# Patient Record
Sex: Male | Born: 1960 | Race: White | Hispanic: No | Smoking: Current every day smoker
Health system: Southern US, Community
[De-identification: ages and names within clinical notes are randomized; demographics above are authoritative.]

## PROBLEM LIST (undated history)

## (undated) DIAGNOSIS — K8020 Calculus of gallbladder without cholecystitis without obstruction: Secondary | ICD-10-CM

## (undated) DIAGNOSIS — E079 Disorder of thyroid, unspecified: Secondary | ICD-10-CM

## (undated) DIAGNOSIS — J449 Chronic obstructive pulmonary disease, unspecified: Secondary | ICD-10-CM

## (undated) DIAGNOSIS — R7303 Prediabetes: Secondary | ICD-10-CM

## (undated) DIAGNOSIS — E119 Type 2 diabetes mellitus without complications: Secondary | ICD-10-CM

## (undated) DIAGNOSIS — I2510 Atherosclerotic heart disease of native coronary artery without angina pectoris: Secondary | ICD-10-CM

## (undated) DIAGNOSIS — M549 Dorsalgia, unspecified: Secondary | ICD-10-CM

## (undated) DIAGNOSIS — E032 Hypothyroidism due to medicaments and other exogenous substances: Secondary | ICD-10-CM

## (undated) HISTORY — PX: BACK SURGERY: SHX140

## (undated) HISTORY — DX: Dorsalgia, unspecified: M54.9

## (undated) HISTORY — DX: Calculus of gallbladder without cholecystitis without obstruction: K80.20

## (undated) HISTORY — DX: Atherosclerotic heart disease of native coronary artery without angina pectoris: I25.10

## (undated) HISTORY — PX: CHOLECYSTECTOMY: SHX55

## (undated) HISTORY — DX: Hypothyroidism due to medicaments and other exogenous substances: E03.2

## (undated) HISTORY — DX: Prediabetes: R73.03

---

## 2000-03-03 ENCOUNTER — Emergency Department (HOSPITAL_COMMUNITY): Admission: EM | Admit: 2000-03-03 | Discharge: 2000-03-03 | Payer: Self-pay | Admitting: Emergency Medicine

## 2001-01-31 ENCOUNTER — Inpatient Hospital Stay (HOSPITAL_COMMUNITY): Admission: RE | Admit: 2001-01-31 | Discharge: 2001-02-01 | Payer: Self-pay | Admitting: Neurosurgery

## 2001-01-31 ENCOUNTER — Encounter: Payer: Self-pay | Admitting: Neurosurgery

## 2001-04-16 ENCOUNTER — Emergency Department (HOSPITAL_COMMUNITY): Admission: EM | Admit: 2001-04-16 | Discharge: 2001-04-16 | Payer: Self-pay | Admitting: *Deleted

## 2001-05-24 ENCOUNTER — Encounter: Payer: Self-pay | Admitting: Neurosurgery

## 2001-05-24 ENCOUNTER — Ambulatory Visit (HOSPITAL_COMMUNITY): Admission: RE | Admit: 2001-05-24 | Discharge: 2001-05-24 | Payer: Self-pay | Admitting: Neurosurgery

## 2001-05-29 ENCOUNTER — Emergency Department (HOSPITAL_COMMUNITY): Admission: EM | Admit: 2001-05-29 | Discharge: 2001-05-30 | Payer: Self-pay | Admitting: Emergency Medicine

## 2001-09-21 ENCOUNTER — Emergency Department (HOSPITAL_COMMUNITY): Admission: EM | Admit: 2001-09-21 | Discharge: 2001-09-21 | Payer: Self-pay | Admitting: Emergency Medicine

## 2002-03-13 ENCOUNTER — Encounter: Payer: Self-pay | Admitting: Emergency Medicine

## 2002-03-13 ENCOUNTER — Emergency Department (HOSPITAL_COMMUNITY): Admission: EM | Admit: 2002-03-13 | Discharge: 2002-03-13 | Payer: Self-pay | Admitting: Emergency Medicine

## 2002-03-24 ENCOUNTER — Emergency Department (HOSPITAL_COMMUNITY): Admission: EM | Admit: 2002-03-24 | Discharge: 2002-03-25 | Payer: Self-pay | Admitting: *Deleted

## 2002-03-25 ENCOUNTER — Encounter: Payer: Self-pay | Admitting: Urology

## 2002-03-25 ENCOUNTER — Encounter: Payer: Self-pay | Admitting: *Deleted

## 2002-03-25 ENCOUNTER — Inpatient Hospital Stay (HOSPITAL_COMMUNITY): Admission: AD | Admit: 2002-03-25 | Discharge: 2002-03-28 | Payer: Self-pay | Admitting: Urology

## 2002-03-27 ENCOUNTER — Encounter: Payer: Self-pay | Admitting: Urology

## 2002-04-25 ENCOUNTER — Ambulatory Visit (HOSPITAL_COMMUNITY): Admission: RE | Admit: 2002-04-25 | Discharge: 2002-04-25 | Payer: Self-pay | Admitting: Family Medicine

## 2002-04-25 ENCOUNTER — Encounter: Payer: Self-pay | Admitting: Family Medicine

## 2002-05-01 ENCOUNTER — Emergency Department (HOSPITAL_COMMUNITY): Admission: EM | Admit: 2002-05-01 | Discharge: 2002-05-01 | Payer: Self-pay | Admitting: Emergency Medicine

## 2002-06-25 ENCOUNTER — Emergency Department (HOSPITAL_COMMUNITY): Admission: EM | Admit: 2002-06-25 | Discharge: 2002-06-25 | Payer: Self-pay | Admitting: Emergency Medicine

## 2002-08-15 ENCOUNTER — Encounter: Admission: RE | Admit: 2002-08-15 | Discharge: 2002-11-13 | Payer: Self-pay

## 2002-11-07 ENCOUNTER — Ambulatory Visit (HOSPITAL_COMMUNITY): Admission: RE | Admit: 2002-11-07 | Discharge: 2002-11-07 | Payer: Self-pay | Admitting: Internal Medicine

## 2002-11-07 ENCOUNTER — Encounter: Payer: Self-pay | Admitting: Internal Medicine

## 2002-11-11 ENCOUNTER — Emergency Department (HOSPITAL_COMMUNITY): Admission: EM | Admit: 2002-11-11 | Discharge: 2002-11-11 | Payer: Self-pay | Admitting: *Deleted

## 2002-11-11 ENCOUNTER — Encounter: Payer: Self-pay | Admitting: *Deleted

## 2002-11-24 ENCOUNTER — Emergency Department (HOSPITAL_COMMUNITY): Admission: EM | Admit: 2002-11-24 | Discharge: 2002-11-24 | Payer: Self-pay | Admitting: *Deleted

## 2002-12-29 ENCOUNTER — Emergency Department (HOSPITAL_COMMUNITY): Admission: EM | Admit: 2002-12-29 | Discharge: 2002-12-30 | Payer: Self-pay | Admitting: *Deleted

## 2003-05-12 ENCOUNTER — Emergency Department (HOSPITAL_COMMUNITY): Admission: EM | Admit: 2003-05-12 | Discharge: 2003-05-12 | Payer: Self-pay | Admitting: Emergency Medicine

## 2003-05-26 ENCOUNTER — Emergency Department (HOSPITAL_COMMUNITY): Admission: EM | Admit: 2003-05-26 | Discharge: 2003-05-26 | Payer: Self-pay | Admitting: Emergency Medicine

## 2003-07-21 ENCOUNTER — Emergency Department (HOSPITAL_COMMUNITY): Admission: EM | Admit: 2003-07-21 | Discharge: 2003-07-21 | Payer: Self-pay | Admitting: Emergency Medicine

## 2003-07-27 ENCOUNTER — Emergency Department (HOSPITAL_COMMUNITY): Admission: EM | Admit: 2003-07-27 | Discharge: 2003-07-27 | Payer: Self-pay | Admitting: Emergency Medicine

## 2003-08-28 ENCOUNTER — Emergency Department (HOSPITAL_COMMUNITY): Admission: EM | Admit: 2003-08-28 | Discharge: 2003-08-28 | Payer: Self-pay | Admitting: Emergency Medicine

## 2003-12-29 ENCOUNTER — Emergency Department (HOSPITAL_COMMUNITY): Admission: EM | Admit: 2003-12-29 | Discharge: 2003-12-29 | Payer: Self-pay | Admitting: Emergency Medicine

## 2004-02-28 ENCOUNTER — Emergency Department (HOSPITAL_COMMUNITY): Admission: EM | Admit: 2004-02-28 | Discharge: 2004-02-28 | Payer: Self-pay | Admitting: Emergency Medicine

## 2004-03-02 ENCOUNTER — Emergency Department (HOSPITAL_COMMUNITY): Admission: EM | Admit: 2004-03-02 | Discharge: 2004-03-02 | Payer: Self-pay | Admitting: Emergency Medicine

## 2004-03-15 ENCOUNTER — Emergency Department (HOSPITAL_COMMUNITY): Admission: EM | Admit: 2004-03-15 | Discharge: 2004-03-15 | Payer: Self-pay | Admitting: Emergency Medicine

## 2004-03-22 ENCOUNTER — Emergency Department (HOSPITAL_COMMUNITY): Admission: EM | Admit: 2004-03-22 | Discharge: 2004-03-23 | Payer: Self-pay | Admitting: *Deleted

## 2004-04-20 ENCOUNTER — Emergency Department (HOSPITAL_COMMUNITY): Admission: EM | Admit: 2004-04-20 | Discharge: 2004-04-20 | Payer: Self-pay | Admitting: Emergency Medicine

## 2004-05-01 ENCOUNTER — Emergency Department (HOSPITAL_COMMUNITY): Admission: EM | Admit: 2004-05-01 | Discharge: 2004-05-01 | Payer: Self-pay | Admitting: Emergency Medicine

## 2004-06-13 ENCOUNTER — Emergency Department (HOSPITAL_COMMUNITY): Admission: EM | Admit: 2004-06-13 | Discharge: 2004-06-13 | Payer: Self-pay | Admitting: Emergency Medicine

## 2004-06-15 ENCOUNTER — Emergency Department (HOSPITAL_COMMUNITY): Admission: EM | Admit: 2004-06-15 | Discharge: 2004-06-15 | Payer: Self-pay | Admitting: Emergency Medicine

## 2004-06-23 ENCOUNTER — Emergency Department (HOSPITAL_COMMUNITY): Admission: EM | Admit: 2004-06-23 | Discharge: 2004-06-23 | Payer: Self-pay | Admitting: Emergency Medicine

## 2004-06-23 ENCOUNTER — Ambulatory Visit (HOSPITAL_COMMUNITY): Admission: RE | Admit: 2004-06-23 | Discharge: 2004-06-23 | Payer: Self-pay | Admitting: Emergency Medicine

## 2005-05-01 ENCOUNTER — Emergency Department (HOSPITAL_COMMUNITY): Admission: EM | Admit: 2005-05-01 | Discharge: 2005-05-01 | Payer: Self-pay | Admitting: Emergency Medicine

## 2005-07-09 ENCOUNTER — Emergency Department (HOSPITAL_COMMUNITY): Admission: EM | Admit: 2005-07-09 | Discharge: 2005-07-09 | Payer: Self-pay | Admitting: Emergency Medicine

## 2008-03-25 ENCOUNTER — Ambulatory Visit (HOSPITAL_COMMUNITY): Admission: RE | Admit: 2008-03-25 | Discharge: 2008-03-25 | Payer: Self-pay | Admitting: Family Medicine

## 2008-05-05 ENCOUNTER — Emergency Department (HOSPITAL_COMMUNITY): Admission: EM | Admit: 2008-05-05 | Discharge: 2008-05-05 | Payer: Self-pay | Admitting: Emergency Medicine

## 2009-06-06 ENCOUNTER — Ambulatory Visit: Admission: RE | Admit: 2009-06-06 | Discharge: 2009-06-06 | Payer: Self-pay | Admitting: Neurology

## 2009-10-04 ENCOUNTER — Ambulatory Visit (HOSPITAL_COMMUNITY): Admission: RE | Admit: 2009-10-04 | Discharge: 2009-10-04 | Payer: Self-pay | Admitting: Family Medicine

## 2010-10-27 ENCOUNTER — Ambulatory Visit (HOSPITAL_COMMUNITY)
Admission: RE | Admit: 2010-10-27 | Discharge: 2010-10-27 | Disposition: A | Discharge status: Home / Self Care | Payer: Medicaid Other | Source: Ambulatory Visit | Attending: Family Medicine | Admitting: Family Medicine

## 2010-10-27 ENCOUNTER — Encounter (HOSPITAL_COMMUNITY): Payer: Self-pay

## 2010-10-27 ENCOUNTER — Other Ambulatory Visit (HOSPITAL_COMMUNITY): Payer: Self-pay | Admitting: Family Medicine

## 2010-10-27 DIAGNOSIS — R0602 Shortness of breath: Secondary | ICD-10-CM | POA: Insufficient documentation

## 2010-10-27 DIAGNOSIS — J449 Chronic obstructive pulmonary disease, unspecified: Secondary | ICD-10-CM | POA: Insufficient documentation

## 2010-10-27 DIAGNOSIS — R059 Cough, unspecified: Secondary | ICD-10-CM | POA: Insufficient documentation

## 2010-10-27 DIAGNOSIS — R05 Cough: Secondary | ICD-10-CM | POA: Insufficient documentation

## 2010-10-27 DIAGNOSIS — R634 Abnormal weight loss: Secondary | ICD-10-CM | POA: Insufficient documentation

## 2010-10-27 DIAGNOSIS — J4489 Other specified chronic obstructive pulmonary disease: Secondary | ICD-10-CM | POA: Insufficient documentation

## 2010-10-27 HISTORY — DX: Chronic obstructive pulmonary disease, unspecified: J44.9

## 2010-11-02 ENCOUNTER — Other Ambulatory Visit (HOSPITAL_COMMUNITY): Payer: Self-pay | Admitting: Family Medicine

## 2010-11-02 DIAGNOSIS — E0590 Thyrotoxicosis, unspecified without thyrotoxic crisis or storm: Secondary | ICD-10-CM

## 2010-11-07 ENCOUNTER — Encounter (HOSPITAL_COMMUNITY): Payer: Medicaid Other

## 2010-11-08 ENCOUNTER — Encounter (HOSPITAL_COMMUNITY): Admission: RE | Admit: 2010-11-08 | Payer: Medicaid Other | Source: Ambulatory Visit

## 2010-11-10 ENCOUNTER — Encounter (HOSPITAL_COMMUNITY): Payer: Self-pay

## 2010-11-10 ENCOUNTER — Encounter (HOSPITAL_COMMUNITY): Payer: Medicaid Other

## 2010-11-10 ENCOUNTER — Encounter (HOSPITAL_COMMUNITY)
Admission: RE | Admit: 2010-11-10 | Discharge: 2010-11-10 | Disposition: A | Discharge status: Home / Self Care | Payer: Medicaid Other | Source: Ambulatory Visit | Attending: Family Medicine | Admitting: Family Medicine

## 2010-11-10 DIAGNOSIS — E0590 Thyrotoxicosis, unspecified without thyrotoxic crisis or storm: Secondary | ICD-10-CM

## 2010-11-10 DIAGNOSIS — E349 Endocrine disorder, unspecified: Secondary | ICD-10-CM | POA: Insufficient documentation

## 2010-11-10 DIAGNOSIS — R634 Abnormal weight loss: Secondary | ICD-10-CM | POA: Insufficient documentation

## 2010-11-10 MED ORDER — SODIUM IODIDE I 131 CAPSULE
10.0000 | Freq: Once | INTRAVENOUS | Status: AC | PRN
  Administered 2010-11-10: 13.9 via ORAL

## 2010-11-10 MED ORDER — SODIUM PERTECHNETATE TC 99M INJECTION
10.0000 | Freq: Once | INTRAVENOUS | Status: DC | PRN
  Administered 2010-11-11: 10.5 via INTRAVENOUS

## 2010-12-09 ENCOUNTER — Other Ambulatory Visit (HOSPITAL_COMMUNITY): Payer: Self-pay | Admitting: "Endocrinology

## 2010-12-09 DIAGNOSIS — E0500 Thyrotoxicosis with diffuse goiter without thyrotoxic crisis or storm: Secondary | ICD-10-CM

## 2010-12-12 ENCOUNTER — Ambulatory Visit (HOSPITAL_COMMUNITY)
Admission: RE | Admit: 2010-12-12 | Discharge: 2010-12-12 | Disposition: A | Discharge status: Home / Self Care | Payer: Medicaid Other | Source: Ambulatory Visit | Attending: "Endocrinology | Admitting: "Endocrinology

## 2010-12-12 DIAGNOSIS — E0590 Thyrotoxicosis, unspecified without thyrotoxic crisis or storm: Secondary | ICD-10-CM | POA: Insufficient documentation

## 2010-12-12 DIAGNOSIS — E0500 Thyrotoxicosis with diffuse goiter without thyrotoxic crisis or storm: Secondary | ICD-10-CM

## 2010-12-12 MED ORDER — SODIUM IODIDE I 131 CAPSULE
20.0000 | Freq: Once | INTRAVENOUS | Status: AC | PRN
  Administered 2010-12-12: 20.2 via ORAL

## 2011-01-20 NOTE — Consult Note (Signed)
NAME:  Steven Larson, ASBRIDGE NO.:  [id number]   MEDICAL RECORD NO.:  [id number]                   PATIENT TYPE:  REC   LOCATION:  [facility]                                  FACILITY:  [facility]   PHYSICIAN:  Zachary George, DO                      DATE OF BIRTH:  Mar 02, 1961   DATE OF CONSULTATION:  08/18/2002  DATE OF DISCHARGE:                                   CONSULTATION   REFERRING PHYSICIAN:  Mr. Carneal was referred by Dr. Butch Penny, [address] [place], [place], [address].   Dear Dr. Renard Matter,  Thank you very much for kindly referring Mr. [facility] to [facility] for evaluation.  Steven Larson was evaluated  in the clinic today.  Please refer to the following for details regarding  the history and physical examination, and treatment recommendations.  Once  again, thank you for allowing [place] to participate in the care of Steven Larson.   CHIEF COMPLAINT:  Pain running from back down in leg.   HISTORY OF PRESENT ILLNESS:  The patient is a pleasant 50 year old right-  hand dominant male who complains of chronic low back pain.  He states he  injured his low back initially in 2001, while loading wood on a truck.  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  He was found to have a disk herniation and underwent lumbar surgery in  5/01, per Dr. Newell Coral.  He continues to have pain radiating from his low  back into his left buttock, anterior thigh, and anterior leg.  He was  referred by Dr. Newell Coral to Dr. Thyra Breed approximately one year ago  where he underwent three lumbar epidural steroid injections without relief,  and was referred back to Dr. Newell Coral and then to his primary care  physician, Dr. Renard Matter.  His pain is an 8/10 on a subjective scale, and  described as constant with numbness in his left anterior lateral thigh.  He  also describes some weakness in the associated  distribution.  His symptoms  are worse with walking, bending, and sitting, and improved with medications  to some degree.  He states that Dr. Renard Matter has had him taking Lortab 10  mg/500 mg approximately six per day over the past year or two.  He also  takes Aleve two pills q.12h. which helps as well.  He has tried Avinza which  causes an itch.  He has also tried Tylox per Dr. Newell Coral.  He has been on  Vioxx, Celebrex, and Neurontin without relief, and has also tried Ultracet  and Elavil.  He states the Elavil caused him a drunk feeling.  He denies any  bowel and bladder dysfunction.  He denies any fevers, chills, night sweats,  weight loss.  I reviewed health and history form and 14 point review of  systems.  Function and quality of life indices have declined.  Sleep is fair  to poor.   PAST MEDICAL HISTORY:  Denies.   PAST SURGICAL HISTORY:  Lumbar surgery as noted.   FAMILY HISTORY:  Heart disease, cancer.   SOCIAL HISTORY:  The patient smokes two packs of cigarettes per day, and I  counseled him on the importance of smoking cessation in terms of pain  control and overall health.  Alcohol denies.  Illicit drug use denies.  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   ALLERGIES:  No known drug allergies.   MEDICATIONS:  1. Lortab.  2. Aleve.   PHYSICAL EXAMINATION:  GENERAL:  A healthy-appearing male in no acute  distress.  VITAL SIGNS:  Blood pressure is 141/68, pulse 77, respirations 20, O2  saturation 97% on room air.  BACK:  Level pelvis without scoliosis.  There is a healed vertical midline  incisional scar.  Range of motion of the lumbar spine is limited in all  planes secondary to discomfort.  There is tenderness to palpation bilateral  lumbar paraspinal muscles.  Manual muscle testing is 5/5 bilateral lower  extremities.  Sensory examination reveals decreased light  touch in the left  anterior lateral thigh proximally.  Muscle stretch reflexes are 2+/4  bilateral patellar, medial hamstrings, and Achilles.  Straight leg raise is  negative bilaterally, but with significant tightness in the hamstrings.  Pearlean Brownie is negative bilaterally, but with significant tightness in the hip  flexor muscles bilaterally.  There is no heat, erythema, or edema in the  lower extremities.  There is no abnormal tone in the lower extremities.  Ankle clonus is absent bilaterally.  Gait is normal.   LABORATORY DATA:  Imaging studies:  MRI of the lumbar spine dated 04/25/02,  reveals stable mild diffuse disk bulging without significant canal or  foraminal stenosis at L3-4.  At L4-5, there is interval development of  enhancing scar tissue within the neural foramen and extra foraminal soft  tissues on the left, as well as in the posterior lateral aspect of the disk  on the left.  The scar tissue is enhancing the left L4 nerve root within and  lateral to the neural foramen with no nerve root displacement.  No recurrent  disk herniation is identified.  Also noted is surgical absence of the  lateral aspects of the facets on the left at the this level.  There has been  no significant change in mild to moderate diffuse disk bulking and mild  facet hypertrophy on the right at this level without significant canal or  foraminal stenosis.  At L5-S1, there is stable minimal diffuse disk bulging  without focal disk herniation.  There is mild bilateral facet hypertrophy  noted with mild progression.   IMPRESSION:  1. Chronic low back pain, status post lumbar surgery with persistent left     lower extremity radicular symptoms in an L4 distribution with scar tissue     enhancement surrounding the left L4 nerve root.  2. Left L4 radiculitis/radiculopathy, likely secondary to scar tissue     postoperatively. 3. Non-restorative sleep disorder.  4. Tobacco abuse.   RECOMMENDATIONS:  1. Mr.  Mirsky and I discussed treatment options.  I will make     recommendations to his primary care  physician for a plan of care.  2. Recommend continuing a home based exercise program concentrating on     lumbar stabilization exercises as well as proper body mechanics,     especially if the patient is to re-enter the work force in a physical     type job in the tree business.  3. Consider a membrane stabilizing medication such as Zonegran 100 mg daily.     This could be increased by 100 mg every two weeks up to 400 mg.     Typically, if no improvement is noted after 200 mg, I recommend     discontinuing this medication and switching to another such as Gabitril     up to 3200 mg q.d. beginning with 4 mg q.h.s. x3 days, b.i.d. x3 days,     then t.i.d. initially.  If symptoms do not improve with this, could give     consideration to re-initiating Neurontin up to 1800 mg daily starting     with 300 mg q.h.s. x3 days, b.i.d. x3 days, then t.i.d.  4. Consider Pamelor 10 to 25 mg at bedtime, and increasing up to 50 mg as     tolerated.  5. Consider following up with Dr. Vear Clock who was his pain management     physician previously.  Dr. Vear Clock was involved in his interventional     pain management.  6. Consider a trial of Duragesic 25 mcg q.72h. if symptoms are not improving     with the above noted measures.  7. Recommend smoking cessation for best long-term outcome.  8. Consider Lidoderm patches for 12 hours per day to his lower back.  9. I do not feel that further imaging studies are warranted at this time.  10.      I do not feel that further interventional procedures are warranted     at this time.  Could give consideration to a left L4 selective nerve root     block if symptoms are not improving with the above medications.  11.      The patient is to follow up with Dr. Renard Matter.   The patient was educated on the above findings and recommendations and  understands.  There were no barriers  to communication.                                               Zachary George, DO    JW/MEDQ  D:  08/18/2002  T:  08/19/2002  Job:  [id number]   cc:   Angus G. Renard Matter, M.D.  [address]  [place]  [place] [address]  Fax: [phone]

## 2011-01-20 NOTE — H&P (Signed)
[place]  Patient:    Steven Larson, Steven Larson                          MRN: [id number] Adm. Date:  [id number] Attending:  Barton Fanny                         History and Physical  HISTORY OF PRESENT ILLNESS:  The patient is a 50 year old right handed white male who was evaluated for a left lumbar radiculopathy secondary to a left L4-5 lumbar extraforaminal disk herniation. 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 He was lifting a heavy oak log. Since then he has had pain and discomfort in his pain extending down to his left buttock and thigh and numbness in the anterior aspect of his left thigh. He states that if stands for an extended period of time, he will begin to get pain and numbness through the left lower extremity and if he is climbing a tree striking in he will get jolts of pain and discomfort. He has been taking Tylox at night to help him rest. He was evaluated with an MRI scan that showed degenerative disk disease L3-4, L4-5, and L5-S1 with left L4-5 foraminal and extraforaminal disk herniation with compression of the left L4 nerve roots. The patient is admitted now for surgery.  PAST MEDICAL HISTORY:  There is no history of hypertension, myocardial infarction, cancer, stroke, diabetes, peptic ulcer disease or lung disease. No previous surgeries, no known allergies.  CURRENT MEDICATIONS:  Tylox.  FAMILY HISTORY:  Parents have both passed on.  SOCIAL HISTORY:  The patient is separated. He has custody of his son. He smokes two packs a day. He has been smoking for 23 years. He does not drink alcohol and denies a history of substance abuse.  REVIEW OF SYSTEMS:  Review of systems is as described in his history of present illness and his past medical history was otherwise unremarkable.  PHYSICAL EXAMINATION:  GENERAL:  The patient is a well-developed, well-nourished, white male in no acute  distress.  VITAL SIGNS:  Temperature 97.3, pulse 88, blood pressure 122/70, respiratory rate 18, height 6 foot, weight 200 pounds.  LUNGS:  Clear to auscultation. She has symmetrical respirations.  HEART:  Regular rate and rhythm. Normal S1, S2. There is no murmur.  ABDOMEN:  Soft, nondistended, bowel sounds are present.  EXTREMITIES:  Shows no cyanosis, clubbing or edema.  MUSCULOSKELETAL:  Shows tenderness to palpation in the left perilumbar region and none in the paralumbar region, none in the right paralumbar spinous processes themselves. He is limited in forward flexion to about 20-30 degrees due to pain and discomfort. He is limited in extension as well due to pain and discomfort. Straight leg raising on the left at about 80-90 degrees produced pain at the left side of his low back. Straight leg raising is negative on the right.  NEUROLOGIC:  5/5 strength in the lower extremity including dorsiflexion, plantar flexion, and extensor hallucis longus. Sensation intact to pinprick in the lower extremities. Reflexes are 1-2 at the quadriceps and gastrocnemii. and are symmetrical bilaterally, toes are downgoing bilaterally. He has a normal gait and stance.  IMPRESSION:  Left lumbar radiculopathy secondary to a L4-5 foraminal and extraforaminal disk herniation superimposed upon underlying degenerative disk disease.  PLAN:  The patient will be admitted for a left L4-5 extraforaminal microdiskectomy.  I have discussed the alternatives to surgery, the nature of the surgical procedure itself and typical risks of surgery, hospital stay and overall recuperation, his limitations during the postoperative period and risks of surgery including risks of infection, bleeding, possibility of transfusions, the risk of nerve dysfunction and pain, weakness, numbness, paresthesias, risk of recurrent disk herniations, possible need for further surgery, anesthetic risks, myocardial infarction. After  expressing an understanding of all this, he does wish to proceed with surgery and is admitted for such. DD:  01/31/01 TD:  01/31/01 Job: [id number]

## 2011-01-20 NOTE — Op Note (Signed)
[facility]  Patient:    Steven Larson, Steven Larson Visit Number: [id number] MRN: [id number]          Service Type: MED Location: 3A A326 01 Attending Physician:  Dennie Maizes Dictated by:   Dennie Maizes, M.D. Proc. Date: 03/27/02 Admit Date:  03/25/2002 Discharge Date: 03/28/2002                             Operative Report  PREOPERATIVE DIAGNOSES:  1. Left distal ureteral calculus with obstruction.  2. Left renal colic.  3. Left hydronephrosis.  POSTOPERATIVE DIAGNOSES:  1. Left distal ureteral calculus with obstruction.  2. Left renal colic.  3. Left hydronephrosis.  OPERATION/PROCEDURE:  1. Cystoscopy.  2. Left retrograde pyelogram.  3. Ureteroscopic stone manipulation and left ureteral stent placement.  SURGEON:  Dennie Maizes, M.D.  ANESTHESIA:  General.  COMPLICATIONS:  None.  DRAINS:  Six French 26 cm size left ureteral stent with a string.  INDICATIONS FOR PROCEDURE:  This 50 year old male was evaluated for severe left renal colic.  He had a 4 mm size left upper ureteral calculus with obstruction.  He is taken to the operating room today for cystoscopy, left retrograde pyelogram, ureteroscopic stone extraction, and stent placement.  DESCRIPTION OF PROCEDURE:  General anesthesia was induced and the patient was placed on the OR table in the dorsal lithotomy position.  The lower abdomen and genitalia were prepped and draped in sterile fashion.  Cystoscopy was done with a 25 [place] scope.  Appearance of the urethra, prostate, and bladder were normal.  A 5 French Burch catheter was then placed in the left ureteral orifices.  About 7 cc of Renografin 60 was injected into the collecting system and a retrograde pyelogram done with C-arm fluoroscopy.  There was a filling defect about 5 mm in size about 5 cm above the ureteral orifice.  The collecting system above the filling defect was found to be moderately dilated.  A 5 French open-end  catheter was then placed in the left distal ureter.  A 0.08 ______ with a flexible tip was then advanced into the left collecting system.  The distal ureter was then dilated using an 33 French balloon dilating catheter.  The balloon dilating catheter was then removed, leaving the guide wire in place.  The rigid ureteroscope was then inserted into the left distal ureter.  There was a lot of fibrosis and narrowing just distal to the stone.  The stone was seen with some difficulty.  A four-wire basket was then inserted up to the level of the stone.  Several attempts were made to retrieve the stone.  The stone crumbled into pieces during these attempts. Several sand-like particles were removed.  Some of the stone fragments floated into the upper collecting system.  The stone was fragmented into several pieces and each could be passed easily.  A 6 French 26 cm size stent with a string was then inserted into the left collecting system.  Instruments were removed.  The patient was transferred to the PACU in satisfactory condition. Dictated by:   Dennie Maizes, M.D. Attending Physician:  Dennie Maizes DD:  03/27/02 TD:  04/01/02 Job: [id number]

## 2011-01-20 NOTE — Op Note (Signed)
[name]  Patient:    Steven Larson, Steven Larson                          MRN: [id number] Proc. Date: 01/31/01 Adm. Date:  [id number] Attending:  Barton Fanny CC:         Hewitt Shorts, M.D.  Patients chart   Operative Report  PREOPERATIVE DIAGNOSIS:  Left lumbar vertebrae-4/5 extraforaminal lumbar disc herniation.  POSTOPERATIVE DIAGNOSIS:  Left lumbar vertebrae-4/5 extraforaminal lumbar disc herniation.  OPERATION:  Left L4/5 extraforaminal microdiskectomy with microdissection.  SURGEON:  Hewitt Shorts, M.D.  ASSISTANT:  Tanya Nones. Jeral Fruit, M.D.  ANESTHESIA:  General endotracheal anesthesia.  INDICATION:  The patient is a 50 year old man who presented with left lumbar radiculopathy found to be due to a left L4/5 foraminal and extraforaminal disc herniation.  Decision was made to proceed with extraforaminal microdiskectomy.  DESCRIPTION OF PROCEDURE:  The patient brought to the operating room and placed under general endotracheal anesthesia.  The patient was turned to a prone position.  Lumbar region was prepped with DuraPrep and draped in a sterile fashion.  The midline was infiltrated with local anesthetic with epinephrine and a localizing x-ray taken.  The L4/5 level identified and a midline incision made.  It was carried down to the subcutaneous tissue. Bipolar cautery and electrocautery were used to maintain hemostasis.  Dissection was carried down to the lumbar fascia which was incised on the left side of the midline and the paraspinous muscle was dissected from the spinous process of the lamina in a subperiosteal fashion.  Another x-ray was taken at the L4/5 level to further localize.  Then we were able to dissect laterally over the facet joint.  We identified the left L5 transverse process and another aspect of the facet complex.  The microscope was draped and brought in the field to provide additional  magnification, illumination, and visualization and the remainder of the procedure was performed using microdissection with microsurgical technique.  We were able to perform a lateral fasciectomy as well as removing the superior aspect of the transverse process with the micro-max drill.  We dissected down and identified the left L4 nerve root and the annulus of the L4/5 disc.  The disc herniation was subligamentous and there was spondylitic disc protrusion as well.  We were able to incise the annulus and proceeded with the discectomy removing all loose fragments of disc material from both disc space and the extraforaminal space.  Spondylitic overgrowth and spondylitic ligament thickening was removed and we were able to achieve good decompression of the nerve root.  Once all loose fragments of disc material were removed, hemostasis was established with the use of bipolar cautery.  We then instilled 2 cc of fentanyl and 80 mg of Depo-Medrol into the extraforaminal space and then proceeded with closure.  The deep fascia was closed with interrupted undyed 0 Vicryl sutures.  The subcutaneous and subcuticular closed with interrupted inverted 2-0 undyed Vicryl sutures.  The skin was reapproximated with Dermabond.  The patient tolerated the procedure well.  The estimated blood loss was 50 cc. Sponge and needle count were correct.  Following surgery, the patient is to be turned back to the supine position, reversed from anesthetic, extubated, and transferred to the recovery room for further care. DD:  01/31/01 TD:  02/01/01 Job: [id number]

## 2011-01-20 NOTE — H&P (Signed)
[facility]  Patient:    Steven Larson, MCPEEK Visit Number: [id number] MRN: [id number]          Service Type: MED Location: 3A A326 01 Attending Physician:  Dennie Maizes Dictated by:   Dennie Maizes, M.D. Admit Date:  03/25/2002   CC:         Butch Penny, M.D.   History and Physical  CHIEF COMPLAINT:  Severe left flank pain radiating to the front, nausea and vomiting.  HISTORY OF PRESENT ILLNESS:  This 50 year old male came to the emergency room about a week ago complaining severe left pain radiating to the front with associated nausea and vomiting.  He was evaluated with a non-contrast CT scan of the abdomen and pelvis.  This revealed a 4 mm proximal left ureteral calculus with mild obstruction.  The patient was seen in the office and treated with pain pills.  He returned to the office today with persistence of severe pain, nausea and vomiting.  The patient was referred to the hospital for further evaluation.  He had an ER visit last night which revealed a 4 mm stone had migrated to the lower ureter.  The patient was admitted to the hospital for pain control, IV fluids, and treatment.  PAST MEDICAL HISTORY:  Negative for urolithiasis.  No medical illnesses. Status post back surgery in May 2002.  MEDICATIONS:  None.  ALLERGIES:  None.  PHYSICAL EXAMINATION:  GENERAL:  The patient was found to be in severe pain.  HEENT:  Normal.  NECK:  No masses.  LUNGS:  Clear to auscultation.  HEART:  Regular rate and rhythm with no murmurs.  ABDOMEN:  Soft.  No palpable flank mass.  Moderate left costovertebral angle tenderness as noted.  Bladder not palpable.  GU:  Penis and testes were normal.  LABORATORY DATA:  CBC: WBC 9.7, hemoglobin 14.4, hematocrit 41.0.  BUN 13, creatinine 1.3.  IMPRESSION: 1. Left distal ureteral calculus with obstruction. 2. Left hydronephrosis. 3. Left renal colic.  PLAN: 1. We will admit the patient to the  hospital. 2. Narcotics. 3. IV fluids. 4. Discussed with the patient about management options.  If he has severe    and persistent pain, I would proceed with cystoscopy and left retrograde    pyelogram, possible ureteroscopy stone extraction and stent placement.  I have discussed with the patient regarding the operative details, outcome, possible risks and complications, alternative treatments, and the possibility of stone migration during attempted removal. Dictated by:   Dennie Maizes, M.D. Attending Physician:  Dennie Maizes DD:  03/26/02 TD:  03/26/02 Job: [id number]

## 2011-01-20 NOTE — Discharge Summary (Signed)
Steven Larson, Steven Larson                             ACCOUNT NO.:  [id number]   MEDICAL RECORD NO.:  [id number]                   PATIENT TYPE:  INP   LOCATION:  A326                                 FACILITY:  [facility]   PHYSICIAN:  Dennie Maizes, M.D.                DATE OF BIRTH:  10/27/1960   DATE OF ADMISSION:  03/25/2002  DATE OF DISCHARGE:  03/28/2002                                 DISCHARGE SUMMARY   FINAL DIAGNOSIS:  Left distal ureteral calculus with obstruction.   OTHER DIAGNOSES:  1. Left hydronephrosis.  2. Left renal colic.   OPERATIVE PROCEDURE:  Cystoscopy, left retrograde pyelogram, left  ureteroscopy stone manipulation and left ureteral stent placement done on  March 27, 2002.   COMPLICATIONS:  None.   HISTORY OF PRESENT ILLNESS:  This 50 year old man came to the emergency room  about one week ago complaining of severe left flank pain radiating to the  front with associated nausea and vomiting.  He was evaluated with a  noncontrast CT scan of abdomen and pelvis.  This revealed a 4 mm size  proximal left ureteral calculus with mild obstruction.  The patient was seen  subsequently in the office and treated with pain pills.  He returned to the  office on March 25, 2002, with persistent severe pain, nausea, and vomiting.  He was referred to the ER for further evaluation.   X-rays revealed a 4 mm size stone that migrated to the lower ureter.  The  patient was admitted to the hospital for pain control, IV fluids, and  definitive treatment.   PAST MEDICAL HISTORY:  Negative for urolithiasis.  No medical illnesses.   PAST SURGICAL HISTORY:  Status post back surgery in May 2002.   MEDICATIONS:  None.   ALLERGIES:  None.   PHYSICAL EXAMINATION:  GENERAL: The patient was found to be in severe pain.  HEENT: Normal.  NECK: No masses.  LUNGS: Clear to auscultation.  HEART: Regular rate and rhythm; no murmurs.  ABDOMEN: Soft; no palpable flank mass.  Moderate left  costovertebral angle  tenderness was noted.  Bladder was not palpable.  GENITALIA: Penis and testes are normal.   ADMISSION LABORATORIES:  CBC: WBC 9.7, hemoglobin 14.4, hematocrit 41.0.  BUN 13, creatinine 1.3.   COURSE IN THE HOSPITAL:  The patient was admitted to the hospital and  treated with IV fluids and pain narcotics.  He had adequate relief of pain.  An x-ray of the KUB area was done for localization of stone.  This revealed  a faint calcification 4 mm in size at the level of L5 spinous process on the  left side.  The patient was treated with PCA morphine with good control of  the pain.  I discussed with the patient regarding management options.  He  wanted surgical removal of the calculus.  He was taken to the OR on March 27, 2002, and underwent cystoscopy, left retrograde pyelogram, left  ureteroscopic stone manipulation and left ureteral stone placement.  The  patient did well in the postoperative period.  He passed a stone which was  sent for routine analysis.  The patient was discharged and sent home on March 28, 2002.   DISCHARGE MEDICATIONS:  1. Levaquin 5 mg one p.o. q.d. x5 days.  2. Percocet 5/325 one p.o. q.8 h. p.r.n. pain (#20).   FOLLOWUP:  He will return to the office on April 01, 2002, for stent removal.   DISCHARGE INSTRUCTIONS:  The patient was advised to call me for any fevers,  chills, voiding difficulty, or gross hematuria.                                               Dennie Maizes, M.D.    SK/MEDQ  D:  04/12/2002  T:  04/17/2002  Job:  [id number]

## 2012-03-10 ENCOUNTER — Emergency Department (HOSPITAL_COMMUNITY)
Admission: EM | Admit: 2012-03-10 | Discharge: 2012-03-11 | Disposition: A | Discharge status: Home / Self Care | Payer: Medicaid Other | Attending: Emergency Medicine | Admitting: Emergency Medicine

## 2012-03-10 ENCOUNTER — Emergency Department (HOSPITAL_COMMUNITY): Payer: Medicaid Other

## 2012-03-10 ENCOUNTER — Encounter (HOSPITAL_COMMUNITY): Payer: Self-pay | Admitting: *Deleted

## 2012-03-10 DIAGNOSIS — S0083XA Contusion of other part of head, initial encounter: Secondary | ICD-10-CM | POA: Insufficient documentation

## 2012-03-10 DIAGNOSIS — M542 Cervicalgia: Secondary | ICD-10-CM | POA: Insufficient documentation

## 2012-03-10 DIAGNOSIS — Z79899 Other long term (current) drug therapy: Secondary | ICD-10-CM | POA: Insufficient documentation

## 2012-03-10 DIAGNOSIS — IMO0002 Reserved for concepts with insufficient information to code with codable children: Secondary | ICD-10-CM | POA: Insufficient documentation

## 2012-03-10 DIAGNOSIS — R0602 Shortness of breath: Secondary | ICD-10-CM | POA: Insufficient documentation

## 2012-03-10 DIAGNOSIS — F17200 Nicotine dependence, unspecified, uncomplicated: Secondary | ICD-10-CM | POA: Insufficient documentation

## 2012-03-10 DIAGNOSIS — R059 Cough, unspecified: Secondary | ICD-10-CM | POA: Insufficient documentation

## 2012-03-10 DIAGNOSIS — S46912A Strain of unspecified muscle, fascia and tendon at shoulder and upper arm level, left arm, initial encounter: Secondary | ICD-10-CM

## 2012-03-10 DIAGNOSIS — S0003XA Contusion of scalp, initial encounter: Secondary | ICD-10-CM | POA: Insufficient documentation

## 2012-03-10 DIAGNOSIS — J438 Other emphysema: Secondary | ICD-10-CM | POA: Insufficient documentation

## 2012-03-10 DIAGNOSIS — R05 Cough: Secondary | ICD-10-CM | POA: Insufficient documentation

## 2012-03-10 DIAGNOSIS — S1093XA Contusion of unspecified part of neck, initial encounter: Secondary | ICD-10-CM

## 2012-03-10 NOTE — ED Provider Notes (Signed)
History     CSN: [id number]  Arrival date & time 03/10/12  2241   First MD Initiated Contact with Patient 03/10/12 2304      Chief Complaint  Patient presents with  . Neck Pain  . Fall    (Consider location/radiation/quality/duration/timing/severity/associated sxs/prior treatment) Patient is a 51 y.o. male presenting with neck pain and fall. The history is provided by the patient.  Neck Pain  This is a new problem. The current episode started 6 to 12 hours ago. The problem occurs hourly. The problem has been gradually worsening. The pain is associated with a fall (Pt slipped on a skateboard). There has been no fever. The pain is present in the generalized neck. The pain radiates to the left arm. The pain is severe. Exacerbated by: certain positions. The pain is the same all the time. Pertinent negatives include no photophobia and no chest pain. Associated symptoms comments: Left arm and shoulder burning and painful..  Fall Pertinent negatives include no abdominal pain and no hematuria.    Past Medical History  Diagnosis Date  . COPD (chronic obstructive pulmonary disease)   . Emphysema     Past Surgical History  Procedure Date  . Back surgery     History reviewed. No pertinent family history.  History  Substance Use Topics  . Smoking status: Current Everyday Smoker  . Smokeless tobacco: Not on file  . Alcohol Use: No      Review of Systems  Constitutional: Negative for activity change.       All ROS Neg except as noted in HPI  HENT: Positive for neck pain. Negative for nosebleeds.   Eyes: Negative for photophobia and discharge.  Respiratory: Positive for cough and shortness of breath. Negative for wheezing.   Cardiovascular: Negative for chest pain and palpitations.  Gastrointestinal: Negative for abdominal pain and blood in stool.  Genitourinary: Negative for dysuria, frequency and hematuria.  Musculoskeletal: Positive for back pain. Negative for arthralgias.    Skin: Negative.   Neurological: Negative for dizziness, seizures and speech difficulty.  Psychiatric/Behavioral: Negative for hallucinations and confusion.    Allergies  Review of patient's allergies indicates no known allergies.  Home Medications   Current Outpatient Rx  Name Route Sig Dispense Refill  . METHADONE HCL 10 MG PO TABS Oral Take 10 mg by mouth every 8 (eight) hours.    Marland Kitchen PREGABALIN 75 MG PO CAPS Oral Take 75 mg by mouth 2 (two) times daily.      BP 144/77  Pulse 74  Temp 98.1 F (36.7 C)  Resp 20  Ht 6\' 1"  (1.854 m)  Wt 190 lb (86.183 kg)  BMI 25.07 kg/m2  SpO2 97%  Physical Exam  Nursing note and vitals reviewed. Constitutional: He is oriented to person, place, and time. He appears well-developed and well-nourished.  Non-toxic appearance.  HENT:  Head: Normocephalic.  Right Ear: Tympanic membrane and external ear normal.  Left Ear: Tympanic membrane and external ear normal.  Eyes: EOM and lids are normal. Pupils are equal, round, and reactive to light.  Neck: Carotid bruit is not present.       Pt has c collar in place.  Cardiovascular: Normal rate, regular rhythm, normal heart sounds, intact distal pulses and normal pulses.   Pulmonary/Chest: No respiratory distress. He has rhonchi.  Abdominal: Soft. Bowel sounds are normal. There is no tenderness. There is no guarding.  Musculoskeletal: Normal range of motion.       Soreness with ROM of the  left shoulder. FROM of the left elbow and wrist. No deformity. No dislocation. Pulses symmetrical.  Lymphadenopathy:       Head (right side): No submandibular adenopathy present.       Head (left side): No submandibular adenopathy present.  Neurological: He is alert and oriented to person, place, and time. He has normal strength. No cranial nerve deficit or sensory deficit.  Skin: Skin is warm and dry.  Psychiatric: He has a normal mood and affect. His speech is normal.    ED Course  Procedures (including  critical care time)  Labs Reviewed - No data to display No results found.   No diagnosis found.    MDM  I have reviewed nursing notes, vital signs, and all appropriate lab and imaging results for this patient. After neck cleared by CT, pt examined. He has soreness with attempted ROM. No bruits. No step off. Soreness of the left trapezius area. Grip is symmetrical. Pt to see Dr Janna Arch for evaluation and additional treatment of the left neck and shoulder.       Kathie Dike, [place] 03/11/12 [phone]

## 2012-03-10 NOTE — ED Notes (Signed)
Patient states that he was at home and stepped onto his son's skateboard, fell backward and struck the back of his neck on a chair.  States that the incident occurred at approx. 100 pm today.  States he is having pain that shoots down his left arm, burning in nature.  States that his index and middle fingers on his left hand arm numb.  States that he took pain medications at home trying to ease the pain, which did not work and that is why he has come to the ER.

## 2012-03-10 NOTE — ED Notes (Signed)
Pt slipped on sons skateboard earlier today. Pt states he fell on his neck. Pt now c/o neck and arm pain with burning sensation down left arm.

## 2012-03-11 MED ORDER — HYDROCODONE-ACETAMINOPHEN 5-325 MG PO TABS
2.0000 | ORAL_TABLET | Freq: Once | ORAL | Status: AC
  Administered 2012-03-11: 2 via ORAL
  Filled 2012-03-11: qty 2

## 2012-03-11 MED ORDER — ONDANSETRON HCL 4 MG PO TABS
4.0000 mg | ORAL_TABLET | Freq: Once | ORAL | Status: AC
  Administered 2012-03-11: 4 mg via ORAL
  Filled 2012-03-11: qty 1

## 2012-03-11 MED ORDER — HYDROCODONE-ACETAMINOPHEN 7.5-325 MG PO TABS: 1.0000 | ORAL_TABLET | ORAL | Status: AC | PRN

## 2012-03-11 MED ORDER — MELOXICAM 7.5 MG PO TABS: 7.5000 mg | ORAL_TABLET | Freq: Every day | ORAL | Status: AC

## 2012-03-11 MED ORDER — METHOCARBAMOL 500 MG PO TABS
1000.0000 mg | ORAL_TABLET | Freq: Once | ORAL | Status: AC
  Administered 2012-03-11: 1000 mg via ORAL
  Filled 2012-03-11: qty 2

## 2012-03-11 MED ORDER — IBUPROFEN 800 MG PO TABS
800.0000 mg | ORAL_TABLET | Freq: Once | ORAL | Status: AC
  Administered 2012-03-11: 800 mg via ORAL
  Filled 2012-03-11: qty 1

## 2012-03-11 MED ORDER — METHOCARBAMOL 500 MG PO TABS: 500.0000 mg | ORAL_TABLET | Freq: Two times a day (BID) | ORAL | Status: AC

## 2012-03-11 NOTE — ED Notes (Signed)
Patient states that "it is hurting a little bit."  States that he took a 10 mg methadone and lyrica prior to coming to the ER.

## 2012-03-12 NOTE — ED Provider Notes (Signed)
Medical screening examination/treatment/procedure(s) were performed by non-physician practitioner and as supervising physician I was immediately available for consultation/collaboration.    Eller Sweis D Keldric Poyer, MD 03/12/12 0733 

## 2012-04-11 ENCOUNTER — Other Ambulatory Visit (HOSPITAL_COMMUNITY): Payer: Self-pay | Admitting: Family Medicine

## 2012-04-11 DIAGNOSIS — M542 Cervicalgia: Secondary | ICD-10-CM

## 2012-04-17 ENCOUNTER — Ambulatory Visit (HOSPITAL_COMMUNITY): Payer: Medicaid Other

## 2012-04-22 ENCOUNTER — Ambulatory Visit (HOSPITAL_COMMUNITY)
Admission: RE | Admit: 2012-04-22 | Discharge: 2012-04-22 | Disposition: A | Discharge status: Home / Self Care | Payer: Medicaid Other | Source: Ambulatory Visit | Attending: Family Medicine | Admitting: Family Medicine

## 2012-04-22 DIAGNOSIS — M5020 Other cervical disc displacement, unspecified cervical region: Secondary | ICD-10-CM | POA: Insufficient documentation

## 2012-04-22 DIAGNOSIS — M79609 Pain in unspecified limb: Secondary | ICD-10-CM | POA: Insufficient documentation

## 2012-04-22 DIAGNOSIS — M542 Cervicalgia: Secondary | ICD-10-CM | POA: Insufficient documentation

## 2012-09-26 ENCOUNTER — Other Ambulatory Visit (HOSPITAL_COMMUNITY): Payer: Self-pay | Admitting: Family Medicine

## 2012-09-26 ENCOUNTER — Ambulatory Visit (HOSPITAL_COMMUNITY)
Admission: RE | Admit: 2012-09-26 | Discharge: 2012-09-26 | Disposition: A | Discharge status: Home / Self Care | Payer: Medicaid Other | Source: Ambulatory Visit | Attending: Family Medicine | Admitting: Family Medicine

## 2012-09-26 DIAGNOSIS — J189 Pneumonia, unspecified organism: Secondary | ICD-10-CM | POA: Insufficient documentation

## 2012-09-26 DIAGNOSIS — R079 Chest pain, unspecified: Secondary | ICD-10-CM | POA: Insufficient documentation

## 2012-09-26 DIAGNOSIS — R52 Pain, unspecified: Secondary | ICD-10-CM

## 2012-09-26 DIAGNOSIS — R059 Cough, unspecified: Secondary | ICD-10-CM | POA: Insufficient documentation

## 2012-09-26 DIAGNOSIS — R05 Cough: Secondary | ICD-10-CM

## 2012-11-12 ENCOUNTER — Ambulatory Visit (HOSPITAL_COMMUNITY)
Admission: RE | Admit: 2012-11-12 | Discharge: 2012-11-12 | Disposition: A | Discharge status: Home / Self Care | Payer: Medicaid Other | Source: Ambulatory Visit | Attending: Anesthesiology | Admitting: Anesthesiology

## 2012-11-12 DIAGNOSIS — M5030 Other cervical disc degeneration, unspecified cervical region: Secondary | ICD-10-CM | POA: Insufficient documentation

## 2012-11-12 DIAGNOSIS — M47812 Spondylosis without myelopathy or radiculopathy, cervical region: Secondary | ICD-10-CM | POA: Insufficient documentation

## 2012-11-12 DIAGNOSIS — M6281 Muscle weakness (generalized): Secondary | ICD-10-CM | POA: Insufficient documentation

## 2012-11-12 DIAGNOSIS — IMO0001 Reserved for inherently not codable concepts without codable children: Secondary | ICD-10-CM | POA: Insufficient documentation

## 2012-11-12 DIAGNOSIS — M542 Cervicalgia: Secondary | ICD-10-CM | POA: Insufficient documentation

## 2012-11-12 DIAGNOSIS — M4802 Spinal stenosis, cervical region: Secondary | ICD-10-CM | POA: Insufficient documentation
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
## 2012-11-15 ENCOUNTER — Ambulatory Visit (HOSPITAL_COMMUNITY)
Admission: RE | Admit: 2012-11-15 | Discharge: 2012-11-15 | Disposition: A | Discharge status: Home / Self Care | Payer: Medicaid Other | Source: Ambulatory Visit

## 2012-11-15 NOTE — Progress Notes (Signed)
Physical Therapy Treatment Patient Details  Name: Steven Larson MRN: [id number] Date of Birth: 07-30-1961  Today's Date: 11/15/2012 Time: 4098-1191 PT Time Calculation (min): 38 min  Visit#: 2 of 4  Re-eval: 12/12/12 Assessment Diagnosis: Neck Pain Next MD Visit: Dr. Laurian Brim -injection on 11/25/12 Prior Therapy: None  Authorization: Medicaid  Authorization Time Period:    Authorization Visit#: 2 of 4   Subjective: Symptoms/Limitations Symptoms: Pt stated neck feeling better tdoay, compliant with HEP twice a day.   Pain Assessment Currently in Pain?: Yes Pain Score:   5 Pain Location: Neck  Precautions/Restrictions  Precautions Precaution Comments: HX OF CANER TO THYROID  Exercise/Treatments Stretches Upper Trapezius Stretch: 3 reps;30 seconds;Other (comment) (HEP worksheet given with stretches) Levator Stretch: 3 reps;30 seconds;Other (comment) (HEP worksheet given with stretches) Theraband Exercises Scapula Retraction: 10 reps;Green Shoulder Extension: 10 reps;Green Rows: 10 reps;Green Standing Exercises Other Standing Exercises: corner elbow presses 3x10 sec holds Seated Exercises Neck Retraction: 10 reps;5 secs X to V: 10 reps W Back: 10 reps Postural Training: during all seated exercises Prone Exercises Other Prone Exercise: POE cervical rotation; SA with tactile cueing 10x  Physical Therapy Assessment and Plan PT Assessment and Plan Clinical Impression Statement: Began POC for postural strengthening and cervical ROM, pt able to demonstrate appropraite technique with exercises with min cuieng for form and core activation to improve posture.  Tactile cueing required for correct musculautre activation with serratus anterior.  Pt able to demonstrate approriate form with UT and levator stretch, pt given printout to add stretches to HEP. PT Plan: HX OF CANCER, Continue with improving his HEP secondary to insurance limitations.  Give pt theraband and worksheet to progress  postural strengthening at home.      Goals    Problem List Patient Active Problem List  Diagnosis  . Neck pain  . Degeneration of cervical intervertebral disc  . Cervical spondylosis without myelopathy  . Spinal stenosis in cervical region    PT - End of Session Activity Tolerance: Patient tolerated treatment well General Behavior During Session: [facility] for tasks performed Cognition: [facility] for tasks performed  GP    Juel Burrow 11/15/2012, 4:16 PM

## 2012-11-18 NOTE — Therapy (Signed)
[facility] ATTNClaris Gower [address] [place], [place] [address] Phone: [phone] Fax: [phone] _ __________________________________________________________________________ RESPONSE TO MISSING INFORMATION _ Patient Name: Steven Larson Date of Birth: 08-07-1961 Address: [address] Therapy Type: PT [place], [place] [address] Requested Dates of Service: 11/12/2012 - 11/26/2012 _ _ [facility] Missing Information Request: * Per Medicaid Policy 10 A Visit Limit for adult recipients, a recipient 65 years of age or older may have 3 combined treatment visits and 1 evaluation visit of all therapies combined (PT, OT, SLP) per calendar year, from all therapy providers. Evaluation does NOT require PA. Are you seeking PA for 3 treatment visits? If so, approval for 3 units (or 12 units) can be issued. Please respond if this is acceptable. If not, please provide additional information: 1. Did a medical event such as CVA/TBI/hip surgery/joint replacement occur? If so, please provide diagnoses and hospital discharge dates to support your request for more than 3 total visits. If review of this information warrants a change of the requested number of visits, frequency, duration, or withdrawal of the request, please indicate that in your response. By responding, you are acknowledging that that the recipient is in agreement to your comments. _ Provider's Response: Requesting 3 visits. I would have liked 2 visits for 1 week and 1 visit the following week, however due to your website, I am unable to request 3 visits in a two week period of time without requesting a fourth visit, Therefore requesting 4 visits for 2x/week for 2 weeks due to inability to only request 3 visits in 2 weeks. Thank you, Annett Fabian, PT _ User submitting information to [facility]: [email or web address] Date: 11/18/2012

## 2012-11-19 ENCOUNTER — Inpatient Hospital Stay (HOSPITAL_COMMUNITY): Admission: RE | Admit: 2012-11-19 | Payer: Medicaid Other | Source: Ambulatory Visit

## 2012-11-21 ENCOUNTER — Ambulatory Visit (HOSPITAL_COMMUNITY)
Admission: RE | Admit: 2012-11-21 | Discharge: 2012-11-21 | Disposition: A | Discharge status: Home / Self Care | Payer: Medicaid Other | Source: Ambulatory Visit | Attending: Anesthesiology | Admitting: Anesthesiology

## 2012-11-21 NOTE — Progress Notes (Signed)
Physical Therapy Treatment Patient Details  Name: Steven Larson MRN: [id number] Date of Birth: 1961-03-02  Today's Date: 11/21/2012 Time: 1302-1342 PT Time Calculation (min): 40 min Charge : therex 58'  Visit#: 3 of 4  Re-eval: 12/12/12 Assessment Diagnosis: Neck Pain Next MD Visit: Dr. Laurian Brim -injection on 11/25/12 Prior Therapy: None  Authorization: Medicaid  Authorization Time Period:    Authorization Visit#: 3 of 4   Subjective: Symptoms/Limitations Symptoms: Pt reported he was really sore today, he climped 4 trees yesterda.  Neck pain 3-4/10 with pain medication prior therapy.   Pain Assessment Currently in Pain?: Yes Pain Score:   4 Pain Location: Neck Pain Orientation: Left  Precautions/Restrictions  Precautions Precaution Comments: HX OF CANER TO THYROID  Exercise/Treatments Stretches Upper Trapezius Stretch: 3 reps;30 seconds;Other (comment) Levator Stretch: 3 reps;30 seconds;Other (comment) Theraband Exercises Scapula Retraction: 15 reps;Blue;Limitations Scapula Retraction Limitations: HEP worksheet and tband Shoulder Extension: 15 reps;Blue;Limitations Shoulder Extension Limitations: HEP worksheet and tband Rows: 15 reps;Blue;Limitations Rows Limitations: HEP worksheet and tband Seated Exercises Neck Retraction: 15 reps;5 secs X to V: 15 reps W Back: 15 reps Money: 10 reps Postural Training: during all seated exercises Prone Exercises Other Prone Exercise: POE cervical rotation; side bend and scapular protract/retract 10x   Physical Therapy Assessment and Plan PT Assessment and Plan Clinical Impression Statement: Pt able to demonstrate appropriate form and technique with tband for postural strengthening, pt given tband and worksheet to add to HEP.  Pt imroving coordination with scapular mobilty, less tactile cueing required for appropriate serratus anterior PT Plan: HX OF CANCER, Continue with improving his HEP secondary to insurance limitations.   Progress to prone exercises for postural strengthening    Goals    Problem List Patient Active Problem List  Diagnosis  . Neck pain  . Degeneration of cervical intervertebral disc  . Cervical spondylosis without myelopathy  . Spinal stenosis in cervical region    PT - End of Session Activity Tolerance: Patient tolerated treatment well General Behavior During Session: [facility] for tasks performed Cognition: [facility] for tasks performed  GP    Juel Burrow 11/21/2012, 1:42 PM

## 2012-11-22 ENCOUNTER — Ambulatory Visit (HOSPITAL_COMMUNITY): Payer: Medicaid Other | Admitting: Physical Therapy

## 2012-11-26 ENCOUNTER — Ambulatory Visit (HOSPITAL_COMMUNITY)
Admission: RE | Admit: 2012-11-26 | Discharge: 2012-11-26 | Disposition: A | Discharge status: Home / Self Care | Payer: Medicaid Other | Source: Ambulatory Visit | Attending: Anesthesiology | Admitting: Anesthesiology

## 2012-11-26 NOTE — Progress Notes (Signed)
Physical Therapy Re-Evaluation  Patient Details  Name: Steven Larson MRN: [id number] Date of Birth: September 20, 1960  Today's Date: 11/26/2012 Time: 4098-1191 PT Time Calculation (min): 44 min              Visit#: 4 of 4  Re-eval: 12/12/12 Diagnosis: Neck Pain Next MD Visit: Dr. Laurian Brim -injection on 11/25/12 Authorization: Medicaid    Authorization Visit#: 3 of 4  Charges:  therex 24', massage 10', MMT, ROM testing   Subjective Symptoms/Limitations Symptoms: Pt states he has increased pain today.  States 7/10 pain today in LT cervical area. Pain Assessment Currently in Pain?: Yes Pain Score:   7 Pain Location: Neck Pain Orientation: Left  Precautions/Restrictions  Precautions Precaution Comments: HX OF CANER TO THYROID    Objective: LUE Strength Left Shoulder Flexion: 5/5 (was 4/5) Left Shoulder Extension: 5/5 (was 4/5) Left Shoulder ABduction: 5/5 (was 4/5) Left Shoulder Internal Rotation: 5/5 (was 3-/5) Left Shoulder External Rotation: 5/5 (was 4/5)  Cervical AROM Cervical Flexion: 5.2 cm (was 5.8 cm) Cervical Extension: 10 cm (was 10.5 cm) Cervical - Right Side Bend: 13 cm (was 15 cm) Cervical - Left Side Bend: 15 cm (was 16 cm) Cervical - Right Rotation: 15.5 cm (was 20 cm) Cervical - Left Rotation: 13.2 cm (was 18 cm)  Cervical Strength Cervical Flexion: 5/5 (was 3/5) Cervical Extension: 4/5 (was 3/5) Cervical - Right Side Bend: 4/5 (was 3/5) Cervical - Left Side Bend: 3+/5 (was 3-/5) Cervical - Right Rotation: 4/5 (was 3/5) Cervical - Left Rotation: 4/5 (was 3-/5)  Exercise/Treatments Stretches Corner Stretch: 3 reps;30 seconds Machines for Strengthening UBE (Upper Arm Bike): 4' backward Standing Exercises Upper Extremity Flexion with Stabilization: 5 reps Other Standing Exercises: corner elbow presses 10x5 sec holds Seated Exercises W Back: 15 reps Prone Exercises W Back: 10 reps Shoulder Extension: 10 reps Rows: 10 reps Other Prone Exercise: POE  cervical rotation; side bend and scapular protract/retract 10x    Manual Therapy Manual Therapy: Massage Massage: prone lying STM B UT to rhomboids concentrating on LT  Physical Therapy Assessment and Plan PT Assessment and Plan Clinical Impression Statement: Added prone postural strengthening exercises with pt. able to perform in correct form without pain. Pt. has completed 4/4 visits and has reached max visits as allowed by insurance.  Pt has met 2/4 goals and L UE strength is now WNL, cervical strength and ROM has progressed with largest deficit still with LT side bending.  STM revealed mm spasms in L UT and rhomboid areas that was resolved with massage.  Pt's pain varies with 8/10 being the worst.  Spoke to pt. regarding continuing to reach remaining goals.  Pt. would like to continue, however will have to discuss financial arrangements with hospital rep.  PT Plan: Pt. would like to continue PT X 4 more weeks to address remaining impairments.  Pt to contact clinic after financial arrangements are made.    Goals Home Exercise Program Pt will Perform Home Exercise Program: Independently PT Goal: Perform Home Exercise Program - Progress: Met  PT Short Term Goals Time to Complete Short Term Goals:  (3 visits) PT Short Term Goal 1: Pt will report pain less than 5/10 for 75% of his day for improved QOL.  PT Short Term Goal 1 - Progress: Not met PT Short Term Goal 2: Pt will improve his L cervical rotation by 2 cm in order to report greater ease when turing his neck while driving.  PT Short Term Goal 2 - Progress: Met  PT Short Term Goal 3: Pt will verbalize and demonstrate approprirate posture in order to decrease risk of secondary impairments.   PT Short Term Goal 3 - Progress: Not met  Problem List Patient Active Problem List  Diagnosis  . Neck pain  . Degeneration of cervical intervertebral disc  . Cervical spondylosis without myelopathy  . Spinal stenosis in cervical region    PT  - End of Session Activity Tolerance: Patient tolerated treatment well General Behavior During Session: [facility] for tasks performed Cognition: [facility] for tasks performed   Lurena Nida, PTA/CLT 11/26/2012, 2:27 PM

## 2013-07-03 ENCOUNTER — Other Ambulatory Visit (HOSPITAL_COMMUNITY): Payer: Self-pay | Admitting: Family Medicine

## 2013-07-03 DIAGNOSIS — M545 Low back pain: Secondary | ICD-10-CM

## 2013-07-09 ENCOUNTER — Other Ambulatory Visit (HOSPITAL_COMMUNITY): Payer: Self-pay | Admitting: Family Medicine

## 2013-07-09 ENCOUNTER — Ambulatory Visit (HOSPITAL_COMMUNITY): Payer: Medicaid Other

## 2013-07-09 DIAGNOSIS — IMO0002 Reserved for concepts with insufficient information to code with codable children: Secondary | ICD-10-CM

## 2013-07-15 ENCOUNTER — Ambulatory Visit (HOSPITAL_COMMUNITY): Payer: Medicaid Other | Attending: Family Medicine

## 2013-07-30 ENCOUNTER — Ambulatory Visit (HOSPITAL_COMMUNITY)
Admission: RE | Admit: 2013-07-30 | Discharge: 2013-07-30 | Disposition: A | Discharge status: Home / Self Care | Payer: Medicaid Other | Source: Ambulatory Visit | Attending: Family Medicine | Admitting: Family Medicine

## 2013-07-30 DIAGNOSIS — M545 Low back pain, unspecified: Secondary | ICD-10-CM | POA: Insufficient documentation

## 2013-07-30 DIAGNOSIS — R5381 Other malaise: Secondary | ICD-10-CM | POA: Insufficient documentation

## 2013-07-30 DIAGNOSIS — IMO0002 Reserved for concepts with insufficient information to code with codable children: Secondary | ICD-10-CM

## 2014-07-02 ENCOUNTER — Ambulatory Visit (HOSPITAL_COMMUNITY)
Admission: RE | Admit: 2014-07-02 | Discharge: 2014-07-02 | Disposition: A | Discharge status: Home / Self Care | Payer: Medicaid Other | Source: Ambulatory Visit | Attending: Diagnostic Radiology | Admitting: Diagnostic Radiology

## 2014-07-02 ENCOUNTER — Other Ambulatory Visit (HOSPITAL_COMMUNITY): Payer: Self-pay | Admitting: Family Medicine

## 2014-07-02 DIAGNOSIS — W14XXXA Fall from tree, initial encounter: Secondary | ICD-10-CM | POA: Insufficient documentation

## 2014-07-02 DIAGNOSIS — W19XXXA Unspecified fall, initial encounter: Secondary | ICD-10-CM

## 2014-07-02 DIAGNOSIS — T1490 Injury, unspecified: Secondary | ICD-10-CM | POA: Diagnosis present

## 2014-10-08 ENCOUNTER — Emergency Department (HOSPITAL_COMMUNITY): Payer: Medicaid Other

## 2014-10-08 ENCOUNTER — Emergency Department (HOSPITAL_COMMUNITY)
Admission: EM | Admit: 2014-10-08 | Discharge: 2014-10-08 | Discharge status: Against Medical Advice | Payer: Medicaid Other | Attending: Emergency Medicine | Admitting: Emergency Medicine

## 2014-10-08 ENCOUNTER — Encounter (HOSPITAL_COMMUNITY): Payer: Self-pay | Admitting: *Deleted

## 2014-10-08 DIAGNOSIS — R079 Chest pain, unspecified: Secondary | ICD-10-CM | POA: Insufficient documentation

## 2014-10-08 DIAGNOSIS — J441 Chronic obstructive pulmonary disease with (acute) exacerbation: Secondary | ICD-10-CM

## 2014-10-08 DIAGNOSIS — J449 Chronic obstructive pulmonary disease, unspecified: Secondary | ICD-10-CM | POA: Diagnosis not present

## 2014-10-08 DIAGNOSIS — Z720 Tobacco use: Secondary | ICD-10-CM | POA: Insufficient documentation

## 2014-10-08 HISTORY — DX: Disorder of thyroid, unspecified: E07.9

## 2014-10-08 LAB — COMPREHENSIVE METABOLIC PANEL
ALT: 33 U/L (ref 0–53)
AST: 43 U/L — ABNORMAL HIGH (ref 0–37)
Albumin: 3.7 g/dL (ref 3.5–5.2)
Alkaline Phosphatase: 67 U/L (ref 39–117)
Anion gap: 4 — ABNORMAL LOW (ref 5–15)
BUN: 14 mg/dL (ref 6–23)
CO2: 29 mmol/L (ref 19–32)
Calcium: 8.5 mg/dL (ref 8.4–10.5)
Chloride: 101 mmol/L (ref 96–112)
Creatinine, Ser: 1.14 mg/dL (ref 0.50–1.35)
GFR calc Af Amer: 84 mL/min — ABNORMAL LOW (ref >90)
GFR calc non Af Amer: 72 mL/min — ABNORMAL LOW (ref >90)
Glucose, Bld: 109 mg/dL — ABNORMAL HIGH (ref 70–99)
Potassium: 3.9 mmol/L (ref 3.5–5.1)
Sodium: 134 mmol/L — ABNORMAL LOW (ref 135–145)
Total Bilirubin: 0.7 mg/dL (ref 0.3–1.2)
Total Protein: 7 g/dL (ref 6.0–8.3)

## 2014-10-08 LAB — CBC WITH DIFFERENTIAL/PLATELET
Basophils Absolute: 0 10*3/uL (ref 0.0–0.1)
Basophils Relative: 1 % (ref 0–1)
Eosinophils Absolute: 0 10*3/uL (ref 0.0–0.7)
Eosinophils Relative: 0 % (ref 0–5)
HCT: 38.4 % — ABNORMAL LOW (ref 39.0–52.0)
Hemoglobin: 12.9 g/dL — ABNORMAL LOW (ref 13.0–17.0)
Lymphocytes Relative: 49 % — ABNORMAL HIGH (ref 12–46)
Lymphs Abs: 1.5 10*3/uL (ref 0.7–4.0)
MCH: 29.1 pg (ref 26.0–34.0)
MCHC: 33.6 g/dL (ref 30.0–36.0)
MCV: 86.5 fL (ref 78.0–100.0)
Monocytes Absolute: 0.4 10*3/uL (ref 0.1–1.0)
Monocytes Relative: 13 % — ABNORMAL HIGH (ref 3–12)
Neutro Abs: 1.2 10*3/uL — ABNORMAL LOW (ref 1.7–7.7)
Neutrophils Relative %: 37 % — ABNORMAL LOW (ref 43–77)
Platelets: 53 10*3/uL — ABNORMAL LOW (ref 150–400)
RBC: 4.44 MIL/uL (ref 4.22–5.81)
RDW: 14.1 % (ref 11.5–15.5)
WBC: 3.1 10*3/uL — ABNORMAL LOW (ref 4.0–10.5)

## 2014-10-08 LAB — TROPONIN I: Troponin I: <0.03 ng/mL (ref <0.031)

## 2014-10-08 MED ORDER — AZITHROMYCIN 250 MG PO TABS
500.0000 mg | ORAL_TABLET | Freq: Once | ORAL | Status: AC
  Administered 2014-10-08: 500 mg via ORAL
  Filled 2014-10-08: qty 2

## 2014-10-08 MED ORDER — PREDNISONE 20 MG PO TABS
40.0000 mg | ORAL_TABLET | Freq: Once | ORAL | Status: AC
  Administered 2014-10-08: 40 mg via ORAL
  Filled 2014-10-08: qty 2

## 2014-10-08 MED ORDER — IPRATROPIUM-ALBUTEROL 0.5-2.5 (3) MG/3ML IN SOLN
3.0000 mL | Freq: Once | RESPIRATORY_TRACT | Status: AC
  Administered 2014-10-08: 3 mL via RESPIRATORY_TRACT
  Filled 2014-10-08: qty 3

## 2014-10-08 NOTE — ED Notes (Addendum)
Fever, cough, sob.  Nausea, no vomiting  Chest pain

## 2014-10-08 NOTE — ED Notes (Signed)
RT called

## 2014-10-08 NOTE — ED Notes (Signed)
MD at bedside. 

## 2014-10-08 NOTE — ED Provider Notes (Signed)
CSN: [id number]     Arrival date & time 10/08/14  1412 History  This chart was scribed for Raeford RazorStephen Ariadna Setter, MD by Tonye RoyaltyJoshua Chen, ED Scribe. This patient was seen in room APA02/APA02 and the patient's care was started at 4:29 PM.    Chief Complaint  Patient presents with  . Chest Pain   The history is provided by the patient. No language interpreter was used.    HPI Comments: Steven Larson is a 54 y.o. male with history of COPD, emphysema, and thyroid disease who presents to the Emergency Department complaining of fever, headache, and constant chest pain described as tightness with onset 4 days ago. He states his temperature measured between 103 and 104. He reports associated blurry vision, cough, SOB, nausea, fatigue, constipation, and urinary urgency. He states he has brought up white phlegm but states it is not really vomiting. He states his SOB improves with inhaler.He also reports swelling to his fingers, hands, ankle, and knees since 4 days ago. He states he has lost 16 pounds in the last 3 weeks and states. He denies appetite change but states he is trying to eat less to lose weight.  He states he was trying to see his PCP and an Urgent Care, but they were not able to work him in. He states he uses 3L O2 at night. He states he has never been told that he has low platelets. He denies sore throat, bleeding anywhere, dark stool, or appetite change.  Past Medical History  Diagnosis Date  . COPD (chronic obstructive pulmonary disease)   . Emphysema   . Thyroid disease    Past Surgical History  Procedure Laterality Date  . Back surgery     History reviewed. No pertinent family history. History  Substance Use Topics  . Smoking status: Current Every Day Smoker    Types: Cigarettes  . Smokeless tobacco: Not on file  . Alcohol Use: No    Review of Systems  Constitutional: Positive for fever, fatigue and unexpected weight change. Negative for appetite change.  HENT: Negative for sore throat.    Eyes: Positive for visual disturbance.  Respiratory: Positive for cough and chest tightness.   Cardiovascular: Positive for chest pain.  Gastrointestinal: Positive for nausea and constipation. Negative for vomiting and blood in stool.  Genitourinary: Positive for urgency. Negative for hematuria.  Musculoskeletal: Positive for joint swelling.  Neurological: Positive for headaches.  All other systems reviewed and are negative.     Allergies  Review of patient's allergies indicates no known allergies.  Home Medications   Prior to Admission medications   Medication Sig Start Date End Date Taking? Authorizing Provider  ALPRAZolam Prudy Feeler(XANAX) 1 MG tablet Take 1 mg by mouth 3 (three) times daily.   Yes Historical Provider, MD  levETIRAcetam (KEPPRA) 500 MG tablet Take 500 mg by mouth 2 (two) times daily.   Yes Historical Provider, MD  levothyroxine (SYNTHROID, LEVOTHROID) 88 MCG tablet Take 88 mcg by mouth daily before breakfast.   Yes Historical Provider, MD  methadone (DOLOPHINE) 10 MG tablet Take 10 mg by mouth every 8 (eight) hours.   Yes Historical Provider, MD  pregabalin (LYRICA) 75 MG capsule Take 75 mg by mouth 2 (two) times daily.   Yes Historical Provider, MD   BP 125/69 mmHg  Pulse 77  Temp(Src) 98.6 F (37 C) (Oral)  Resp 18  Ht 6\' 1"  (1.854 m)  Wt 216 lb (97.977 kg)  BMI 28.50 kg/m2  SpO2 93% Physical Exam  Constitutional: He is oriented to person, place, and time. He appears well-developed and well-nourished.  HENT:  Head: Normocephalic and atraumatic.  Eyes: EOM are normal.  Neck: Normal range of motion.  Cardiovascular: Normal rate, regular rhythm, normal heart sounds and intact distal pulses.   Pulmonary/Chest: Effort normal. No respiratory distress. He has wheezes (expiratory bilaterally).  Abdominal: Soft. He exhibits no distension. There is no tenderness.  Musculoskeletal: Normal range of motion. He exhibits no edema.  Neurological: He is alert and oriented to  person, place, and time.  Skin: Skin is warm and dry.  Psychiatric: He has a normal mood and affect. Judgment normal.  Nursing note and vitals reviewed.   ED Course  Procedures (including critical care time)  DIAGNOSTIC STUDIES: Oxygen Saturation is 94% on room air, adequate by my interpretation.    COORDINATION OF CARE: 4:39 PM Discussed treatment plan with patient at beside, the patient agrees with the plan and has no further questions at this time.   Labs Review Labs Reviewed  CBC WITH DIFFERENTIAL/PLATELET - Abnormal; Notable for the following:    WBC 3.1 (*)    Hemoglobin 12.9 (*)    HCT 38.4 (*)    Platelets 53 (*)    Neutrophils Relative % 37 (*)    Neutro Abs 1.2 (*)    Lymphocytes Relative 49 (*)    Monocytes Relative 13 (*)    All other components within normal limits  COMPREHENSIVE METABOLIC PANEL - Abnormal; Notable for the following:    Sodium 134 (*)    Glucose, Bld 109 (*)    AST 43 (*)    GFR calc non Af Amer 72 (*)    GFR calc Af Amer 84 (*)    Anion gap 4 (*)    All other components within normal limits  TROPONIN I    Imaging Review Dg Chest 2 View  10/08/2014   CLINICAL DATA:  Four day history of fever and shortness of breath  EXAM: CHEST  2 VIEW  COMPARISON:  September 26, 2012 and July 02, 2014  FINDINGS: There is underlying emphysematous change. There are areas of scarring in the mid and lower lung zones bilaterally. There is diffuse interstitial prominence in the bases which is stable. The heart size is normal. The pulmonary vascularity reflects underlying emphysema. No adenopathy. No bone lesions.  IMPRESSION: Underlying emphysema. Scattered scarring in the bases. Diffuse interstitial prominence in the bases in part may be due to redistribution of blood flow to viable segments of lung. There may be underlying usual interstitial pneumonitis or other interstitial pneumonitis entities as well. There is no frank airspace consolidation. No change in cardiac  silhouette.   Electronically Signed   By: Bretta Bang M.D.   On: 10/08/2014 15:02     EKG Interpretation   Date/Time:  Thursday October 08 2014 14:20:18 EST Ventricular Rate:  70 PR Interval:  144 QRS Duration: 90 QT Interval:  416 QTC Calculation: 449 R Axis:   2 Text Interpretation:  Normal sinus rhythm Normal ECG ED PHYSICIAN  INTERPRETATION AVAILABLE IN [facility] Confirmed by TEST, Record  ([id number]) on 10/10/2014 1:21:02 PM      MDM   Final diagnoses:  COPD with acute exacerbation    54yM with what I suspect is COPD exacerbation. Pt eloped from ED before evaluation complete.  I personally preformed the services scribed in my presence. The recorded information has been reviewed is accurate. Raeford Razor, MD.   Raeford Razor, MD 10/15/14 1116

## 2014-10-08 NOTE — ED Notes (Signed)
Pt left without informing staff of leaving

## 2014-10-30 ENCOUNTER — Encounter (HOSPITAL_COMMUNITY): Payer: Self-pay

## 2014-10-30 ENCOUNTER — Emergency Department (HOSPITAL_COMMUNITY)
Admission: EM | Admit: 2014-10-30 | Discharge: 2014-10-30 | Disposition: A | Discharge status: Home / Self Care | Payer: Medicaid Other | Attending: Emergency Medicine | Admitting: Emergency Medicine

## 2014-10-30 ENCOUNTER — Emergency Department (HOSPITAL_COMMUNITY): Payer: Medicaid Other

## 2014-10-30 DIAGNOSIS — Z720 Tobacco use: Secondary | ICD-10-CM | POA: Diagnosis not present

## 2014-10-30 DIAGNOSIS — Z79899 Other long term (current) drug therapy: Secondary | ICD-10-CM | POA: Diagnosis not present

## 2014-10-30 DIAGNOSIS — R0902 Hypoxemia: Secondary | ICD-10-CM | POA: Diagnosis not present

## 2014-10-30 DIAGNOSIS — Z9981 Dependence on supplemental oxygen: Secondary | ICD-10-CM | POA: Diagnosis not present

## 2014-10-30 DIAGNOSIS — E079 Disorder of thyroid, unspecified: Secondary | ICD-10-CM | POA: Diagnosis not present

## 2014-10-30 DIAGNOSIS — J449 Chronic obstructive pulmonary disease, unspecified: Secondary | ICD-10-CM

## 2014-10-30 DIAGNOSIS — J441 Chronic obstructive pulmonary disease with (acute) exacerbation: Secondary | ICD-10-CM | POA: Diagnosis not present

## 2014-10-30 DIAGNOSIS — R5383 Other fatigue: Secondary | ICD-10-CM | POA: Insufficient documentation

## 2014-10-30 LAB — URINALYSIS, ROUTINE W REFLEX MICROSCOPIC
BILIRUBIN URINE: NEGATIVE
Glucose, UA: NEGATIVE mg/dL
KETONES UR: NEGATIVE mg/dL
Leukocytes, UA: NEGATIVE
NITRITE: NEGATIVE
Protein, ur: NEGATIVE mg/dL
SPECIFIC GRAVITY, URINE: 1.01 (ref 1.005–1.030)
UROBILINOGEN UA: 0.2 mg/dL (ref 0.0–1.0)
pH: 5.5 (ref 5.0–8.0)

## 2014-10-30 LAB — CBC WITH DIFFERENTIAL/PLATELET
BASOS ABS: 0.1 10*3/uL (ref 0.0–0.1)
Basophils Relative: 1 % (ref 0–1)
EOS ABS: 0.1 10*3/uL (ref 0.0–0.7)
EOS PCT: 2 % (ref 0–5)
HCT: 39.6 % (ref 39.0–52.0)
Hemoglobin: 13.1 g/dL (ref 13.0–17.0)
Lymphocytes Relative: 45 % (ref 12–46)
Lymphs Abs: 2 10*3/uL (ref 0.7–4.0)
MCH: 28.9 pg (ref 26.0–34.0)
MCHC: 33.1 g/dL (ref 30.0–36.0)
MCV: 87.2 fL (ref 78.0–100.0)
MONO ABS: 0.3 10*3/uL (ref 0.1–1.0)
Monocytes Relative: 7 % (ref 3–12)
Neutro Abs: 2 10*3/uL (ref 1.7–7.7)
Neutrophils Relative %: 44 % (ref 43–77)
PLATELETS: 87 10*3/uL — AB (ref 150–400)
RBC: 4.54 MIL/uL (ref 4.22–5.81)
RDW: 13.8 % (ref 11.5–15.5)
WBC: 4.4 10*3/uL (ref 4.0–10.5)

## 2014-10-30 LAB — COMPREHENSIVE METABOLIC PANEL
ALT: 12 U/L (ref 0–53)
ANION GAP: 3 — AB (ref 5–15)
AST: 16 U/L (ref 0–37)
Albumin: 3.9 g/dL (ref 3.5–5.2)
Alkaline Phosphatase: 86 U/L (ref 39–117)
BUN: 7 mg/dL (ref 6–23)
CO2: 27 mmol/L (ref 19–32)
Calcium: 8.8 mg/dL (ref 8.4–10.5)
Chloride: 106 mmol/L (ref 96–112)
Creatinine, Ser: 0.91 mg/dL (ref 0.50–1.35)
GFR calc Af Amer: >90 mL/min (ref >90)
GFR calc non Af Amer: >90 mL/min (ref >90)
GLUCOSE: 96 mg/dL (ref 70–99)
Potassium: 3.9 mmol/L (ref 3.5–5.1)
SODIUM: 136 mmol/L (ref 135–145)
TOTAL PROTEIN: 6.9 g/dL (ref 6.0–8.3)
Total Bilirubin: 0.2 mg/dL — ABNORMAL LOW (ref 0.3–1.2)

## 2014-10-30 LAB — TROPONIN I: Troponin I: <0.03 ng/mL (ref <0.031)

## 2014-10-30 LAB — URINE MICROSCOPIC-ADD ON

## 2014-10-30 MED ORDER — METHYLPREDNISOLONE SODIUM SUCC 125 MG IJ SOLR
125.0000 mg | Freq: Once | INTRAMUSCULAR | Status: AC
  Administered 2014-10-30: 125 mg via INTRAVENOUS
  Filled 2014-10-30: qty 2

## 2014-10-30 MED ORDER — PREDNISONE 20 MG PO TABS: 60.0000 mg | ORAL_TABLET | Freq: Every day | ORAL | Status: DC

## 2014-10-30 MED ORDER — SODIUM CHLORIDE 0.9 % IV BOLUS (SEPSIS)
1000.0000 mL | Freq: Once | INTRAVENOUS | Status: AC
  Administered 2014-10-30: 1000 mL via INTRAVENOUS

## 2014-10-30 MED ORDER — IPRATROPIUM-ALBUTEROL 0.5-2.5 (3) MG/3ML IN SOLN
3.0000 mL | Freq: Once | RESPIRATORY_TRACT | Status: AC
  Administered 2014-10-30: 3 mL via RESPIRATORY_TRACT
  Filled 2014-10-30: qty 3

## 2014-10-30 MED ORDER — ALBUTEROL SULFATE HFA 108 (90 BASE) MCG/ACT IN AERS: 2.0000 | INHALATION_SPRAY | RESPIRATORY_TRACT | Status: AC | PRN

## 2014-10-30 NOTE — ED Provider Notes (Signed)
This chart was scribed for Layla Maw Kary Sugrue, DO by Luisa Dago, ED Scribe. This patient was seen in room APA18/APA18 and the patient's care was started at 11:20 AM.  TIME SEEN: 11:20 AM  CHIEF COMPLAINT: Fatigue  HPI:  HPI Comments: Steven Larson is a 54 y.o. male with history of COPD who wears 2 L of oxygen at night, continued tobacco use who presents to the Emergency Department complaining of persistent fatigue that has been ongoing for 3 weeks. He endorses an associated intermittent fever with measured TMAX of 102. Last fever episode was last night with unspecified temp. Current ED temp is 98.3. He was seen here in the past for similar symptoms, he was advised to follow up with Dr. Janna Arch. After blood work on Feb 18 he was told that his platelet count was low. Denies any easy bruising or bleeding. Denies any bloody stool or melena. Denies chest pain. Does have chronic shortness of breath and is unchanged. Does have a cough with clear, white sputum production. No numbness, tingling or focal weakness. No vomiting or diarrhea.  No recent steroid use.  ROS: See HPI Constitutional: no fever; positive fatigue and diaphoresis. Eyes: no drainage  ENT: no runny nose   Cardiovascular:  no chest pain  Resp: no SOB; positive cough GI: no vomiting GU: no dysuria Integumentary: no rash  Allergy: no hives  Musculoskeletal: no leg swelling  Neurological: no slurred speech ROS otherwise negative  PAST MEDICAL HISTORY/PAST SURGICAL HISTORY:  Past Medical History  Diagnosis Date  . COPD (chronic obstructive pulmonary disease)   . Emphysema   . Thyroid disease     MEDICATIONS:  Prior to Admission medications   Medication Sig Start Date End Date Taking? Authorizing Provider  ALPRAZolam Prudy Feeler) 1 MG tablet Take 1 mg by mouth 3 (three) times daily.    Historical Provider, MD  levETIRAcetam (KEPPRA) 500 MG tablet Take 500 mg by mouth 2 (two) times daily.    Historical Provider, MD  levothyroxine  (SYNTHROID, LEVOTHROID) 88 MCG tablet Take 88 mcg by mouth daily before breakfast.    Historical Provider, MD  methadone (DOLOPHINE) 10 MG tablet Take 10 mg by mouth every 8 (eight) hours.    Historical Provider, MD  pregabalin (LYRICA) 75 MG capsule Take 75 mg by mouth 2 (two) times daily.    Historical Provider, MD    ALLERGIES:  No Known Allergies  SOCIAL HISTORY:  History  Substance Use Topics  . Smoking status: Current Every Day Smoker    Types: Cigarettes  . Smokeless tobacco: Not on file  . Alcohol Use: No    FAMILY HISTORY: No family history on file.  EXAM: BP 138/86 mmHg  Pulse 68  Temp(Src) 98.3 F (36.8 C) (Oral)  Resp 18  SpO2 90% CONSTITUTIONAL: Alert and oriented and responds appropriately to questions. Chronically ill appearing but no distress; well-nourished HEAD: Normocephalic EYES: Conjunctivae clear, PERRL ENT: normal nose; no rhinorrhea; moist mucous membranes; pharynx without lesions noted NECK: Supple, no meningismus, no LAD  CARD: RRR; S1 and S2 appreciated; no murmurs, no clicks, no rubs, no gallops RESP: Normal chest excursion without splinting or tachypnea; diminished breath sounds to bases; no wheezes, no rhonchi, no rales,  ABD/GI: Normal bowel sounds; non-distended; soft, non-tender, no rebound, no guarding RECTAL: GUAIAC negative. Normal rectal tone; no melena; prostate normal and non-tender; no hemorrhoids BACK:  The back appears normal and is non-tender to palpation, there is no CVA tenderness EXT: Normal ROM in all joints; non-tender  to palpation; no edema; normal capillary refill; no cyanosis    SKIN: Normal color for age and race; warm NEURO: Moves all extremities equally; sensation to light tough diffusely intact; cranial nerves 2-12 grossly intact.  PSYCH: The patient's mood and manner are appropriate. Grooming and personal hygiene are appropriate.  MEDICAL DECISION MAKING: Patient here with very vague complaints of fatigue for the past  several weeks. He was told that he was thrombocytopenic in the past and told to follow-up with his PCP. Denies any bleeding. He has no bruises on exam, guaiac negative. He states he feels short of breath but does have COPD and wears oxygen at night and states this is not significantly worse than normal. No chest pain. Focal neurologic exam is normal. We'll obtain screening labs, urine, chest x-ray. EKG is nonischemic.  ED PROGRESS:  Patient's labs are unremarkable other than a platelet count of 87,000 - this has been found in the past and he has been told about this and instructed to follow up with his PCP. Have advised him to continue to follow-up with his primary care physician for this. Electrolytes normal. Troponin negative. Urine shows trace hemoglobin but no other sign of infection. Chest x-ray shows underlying emphysema but no edema or consolidation. We'll start him on prednisone and albuterol for mild COPD exacerbation many do not feel he needs admission. His oxygen levels are low at rest and have advised him to begin using his oxygen at home and that this may be contributing to his fatigue. Have also counseled him on tobacco cessation. He has asked me for referral for Dr. Juanetta GoslingHawkins who is a pulmonologist. Will get outpatient follow-up information. Discussed return precautions. He verbalizes understanding and is comfortable with plan.    EKG Interpretation  Date/Time:  Friday October 30 2014 10:52:23 EST Ventricular Rate:  73 PR Interval:  160 QRS Duration: 92 QT Interval:  394 QTC Calculation: 434 R Axis:   91 Text Interpretation:  Sinus rhythm Borderline right axis deviation No significant change since last tracing Confirmed by Amoni Morales,  DO, Keiyana Stehr ([id number]) on 10/30/2014 10:58:06 AM        I personally performed the services described in this documentation, which was scribed in my presence. The recorded information has been reviewed and is accurate.   Layla MawKristen N Aniko Finnigan, DO 10/30/14 1517

## 2014-10-30 NOTE — ED Notes (Signed)
Ambulated

## 2014-10-30 NOTE — ED Notes (Signed)
Patient given discharge instruction, verbalized understand. IV removed, band aid applied. Patient ambulatory out of the department.  

## 2014-10-30 NOTE — ED Notes (Signed)
Hemoccult Neg

## 2014-10-30 NOTE — ED Notes (Signed)
Pt getting breathing treatment

## 2014-10-30 NOTE — ED Notes (Signed)
Patient ambulatory to restroom with steady gait, clean catch instructions given and advised pt to bring specimen back to room as well.  

## 2014-10-30 NOTE — ED Notes (Signed)
Pt reports feeling tired and wanting to sleep all of the time for the past 3 weeks.  Reports was seen here for same recently.  Pt reports f/u with Dr. Janna Archondiego on Feb 18th and had blood work done 2 or 3 days ago.  Reports has not heard from the blood work.  Pt reports cough as well.

## 2014-10-30 NOTE — Discharge Instructions (Signed)
Your labs, urine, chest x-ray today were normal. I feel the reason you're feeling tired is because her oxygen levels are low secondary to your history of COPD. I recommend you wear oxygen 2 L at all times during the day and at bedtime. I am referring you to Dr. Juanetta Gosling who is a pulmonologist. I recommended you quit smoking. I am discharging you home on steroids that may help with some of your symptoms.   Fatigue Fatigue is a feeling of tiredness, lack of energy, lack of motivation, or feeling tired all the time. Having enough rest, good nutrition, and reducing stress will normally reduce fatigue. Consult your caregiver if it persists. The nature of your fatigue will help your caregiver to find out its cause. The treatment is based on the cause.  CAUSES  There are many causes for fatigue. Most of the time, fatigue can be traced to one or more of your habits or routines. Most causes fit into one or more of three general areas. They are: Lifestyle problems  Sleep disturbances.  Overwork.  Physical exertion.  Unhealthy habits.  Poor eating habits or eating disorders.  Alcohol and/or drug use .  Lack of proper nutrition (malnutrition). Psychological problems  Stress and/or anxiety problems.  Depression.  Grief.  Boredom. Medical Problems or Conditions  Anemia.  Pregnancy.  Thyroid gland problems.  Recovery from major surgery.  Continuous pain.  Emphysema or asthma that is not well controlled  Allergic conditions.  Diabetes.  Infections (such as mononucleosis).  Obesity.  Sleep disorders, such as sleep apnea.  Heart failure or other heart-related problems.  Cancer.  Kidney disease.  Liver disease.  Effects of certain medicines such as antihistamines, cough and cold remedies, prescription pain medicines, heart and blood pressure medicines, drugs used for treatment of cancer, and some antidepressants. SYMPTOMS  The symptoms of fatigue include:   Lack of  energy.  Lack of drive (motivation).  Drowsiness.  Feeling of indifference to the surroundings. DIAGNOSIS  The details of how you feel help guide your caregiver in finding out what is causing the fatigue. You will be asked about your present and past health condition. It is important to review all medicines that you take, including prescription and non-prescription items. A thorough exam will be done. You will be questioned about your feelings, habits, and normal lifestyle. Your caregiver may suggest blood tests, urine tests, or other tests to look for common medical causes of fatigue.  TREATMENT  Fatigue is treated by correcting the underlying cause. For example, if you have continuous pain or depression, treating these causes will improve how you feel. Similarly, adjusting the dose of certain medicines will help in reducing fatigue.  HOME CARE INSTRUCTIONS   Try to get the required amount of good sleep every night.  Eat a healthy and nutritious diet, and drink enough water throughout the day.  Practice ways of relaxing (including yoga or meditation).  Exercise regularly.  Make plans to change situations that cause stress. Act on those plans so that stresses decrease over time. Keep your work and personal routine reasonable.  Avoid street drugs and minimize use of alcohol.  Start taking a daily multivitamin after consulting your caregiver. SEEK MEDICAL CARE IF:   You have persistent tiredness, which cannot be accounted for.  You have fever.  You have unintentional weight loss.  You have headaches.  You have disturbed sleep throughout the night.  You are feeling sad.  You have constipation.  You have dry skin.  You have gained weight.  You are taking any new or different medicines that you suspect are causing fatigue.  You are unable to sleep at night.  You develop any unusual swelling of your legs or other parts of your body. SEEK IMMEDIATE MEDICAL CARE IF:   You  are feeling confused.  Your vision is blurred.  You feel faint or pass out.  You develop severe headache.  You develop severe abdominal, pelvic, or back pain.  You develop chest pain, shortness of breath, or an irregular or fast heartbeat.  You are unable to pass a normal amount of urine.  You develop abnormal bleeding such as bleeding from the rectum or you vomit blood.  You have thoughts about harming yourself or committing suicide.  You are worried that you might harm someone else. MAKE SURE YOU:   Understand these instructions.  Will watch your condition.  Will get help right away if you are not doing well or get worse. Document Released: 06/18/2007 Document Revised: 11/13/2011 Document Reviewed: 12/23/2013 [facility] Patient Information 2015 [place], [place]. This information is not intended to replace advice given to you by your health care provider. Make sure you discuss any questions you have with your health care provider.  Chronic Obstructive Pulmonary Disease Chronic obstructive pulmonary disease (COPD) is a common lung condition in which airflow from the lungs is limited. COPD is a general term that can be used to describe many different lung problems that limit airflow, including both chronic bronchitis and emphysema. If you have COPD, your lung function will probably never return to normal, but there are measures you can take to improve lung function and make yourself feel better.  CAUSES   Smoking (common).   Exposure to secondhand smoke.   Genetic problems.  Chronic inflammatory lung diseases or recurrent infections. SYMPTOMS   Shortness of breath, especially with physical activity.   Deep, persistent (chronic) cough with a large amount of thick mucus.   Wheezing.   Rapid breaths (tachypnea).   Gray or bluish discoloration (cyanosis) of the skin, especially in fingers, toes, or lips.   Fatigue.   Weight loss.   Frequent infections or  episodes when breathing symptoms become much worse (exacerbations).   Chest tightness. DIAGNOSIS  Your health care provider will take a medical history and perform a physical examination to make the initial diagnosis. Additional tests for COPD may include:   Lung (pulmonary) function tests.  Chest X-ray.  CT scan.  Blood tests. TREATMENT  Treatment available to help you feel better when you have COPD includes:   Inhaler and nebulizer medicines. These help manage the symptoms of COPD and make your breathing more comfortable.  Supplemental oxygen. Supplemental oxygen is only helpful if you have a low oxygen level in your blood.   Exercise and physical activity. These are beneficial for nearly all people with COPD. Some people may also benefit from a pulmonary rehabilitation program. HOME CARE INSTRUCTIONS   Take all medicines (inhaled or pills) as directed by your health care provider.  Avoid over-the-counter medicines or cough syrups that dry up your airway (such as antihistamines) and slow down the elimination of secretions unless instructed otherwise by your health care provider.   If you are a smoker, the most important thing that you can do is stop smoking. Continuing to smoke will cause further lung damage and breathing trouble. Ask your health care provider for help with quitting smoking. He or she can direct you to community resources or hospitals  that provide support.  Avoid exposure to irritants such as smoke, chemicals, and fumes that aggravate your breathing.  Use oxygen therapy and pulmonary rehabilitation if directed by your health care provider. If you require home oxygen therapy, ask your health care provider whether you should purchase a pulse oximeter to measure your oxygen level at home.   Avoid contact with individuals who have a contagious illness.  Avoid extreme temperature and humidity changes.  Eat healthy foods. Eating smaller, more frequent meals and  resting before meals may help you maintain your strength.  Stay active, but balance activity with periods of rest. Exercise and physical activity will help you maintain your ability to do things you want to do.  Preventing infection and hospitalization is very important when you have COPD. Make sure to receive all the vaccines your health care provider recommends, especially the pneumococcal and influenza vaccines. Ask your health care provider whether you need a pneumonia vaccine.  Learn and use relaxation techniques to manage stress.  Learn and use controlled breathing techniques as directed by your health care provider. Controlled breathing techniques include:   Pursed lip breathing. Start by breathing in (inhaling) through your nose for 1 second. Then, purse your lips as if you were going to whistle and breathe out (exhale) through the pursed lips for 2 seconds.   Diaphragmatic breathing. Start by putting one hand on your abdomen just above your waist. Inhale slowly through your nose. The hand on your abdomen should move out. Then purse your lips and exhale slowly. You should be able to feel the hand on your abdomen moving in as you exhale.   Learn and use controlled coughing to clear mucus from your lungs. Controlled coughing is a series of short, progressive coughs. The steps of controlled coughing are:   Lean your head slightly forward.   Breathe in deeply using diaphragmatic breathing.   Try to hold your breath for 3 seconds.   Keep your mouth slightly open while coughing twice.   Spit any mucus out into a tissue.   Rest and repeat the steps once or twice as needed. SEEK MEDICAL CARE IF:   You are coughing up more mucus than usual.   There is a change in the color or thickness of your mucus.   Your breathing is more labored than usual.   Your breathing is faster than usual.  SEEK IMMEDIATE MEDICAL CARE IF:   You have shortness of breath while you are  resting.   You have shortness of breath that prevents you from:  Being able to talk.   Performing your usual physical activities.   You have chest pain lasting longer than 5 minutes.   Your skin color is more cyanotic than usual.  You measure low oxygen saturations for longer than 5 minutes with a pulse oximeter. MAKE SURE YOU:   Understand these instructions.  Will watch your condition.  Will get help right away if you are not doing well or get worse. Document Released: 05/31/2005 Document Revised: 01/05/2014 Document Reviewed: 04/17/2013 [facility] Patient Information 2015 [place]. This information is not intended to replace advice given to you by your health care provider. Make sure you discuss any questions you have with your health care provider.  Smoking Cessation Quitting smoking is important to your health and has many advantages. However, it is not always easy to quit since nicotine is a very addictive drug. Oftentimes, people try 3 times or more before being able to quit. This  document explains the best ways for you to prepare to quit smoking. Quitting takes hard work and a lot of effort, but you can do it. ADVANTAGES OF QUITTING SMOKING  You will live longer, feel better, and live better.  Your body will feel the impact of quitting smoking almost immediately.  Within 20 minutes, blood pressure decreases. Your pulse returns to its normal level.  After 8 hours, carbon monoxide levels in the blood return to normal. Your oxygen level increases.  After 24 hours, the chance of having a heart attack starts to decrease. Your breath, hair, and body stop smelling like smoke.  After 48 hours, damaged nerve endings begin to recover. Your sense of taste and smell improve.  After 72 hours, the body is virtually free of nicotine. Your bronchial tubes relax and breathing becomes easier.  After 2 to 12 weeks, lungs can hold more air. Exercise becomes easier and  circulation improves.  The risk of having a heart attack, stroke, cancer, or lung disease is greatly reduced.  After 1 year, the risk of coronary heart disease is cut in half.  After 5 years, the risk of stroke falls to the same as a nonsmoker.  After 10 years, the risk of lung cancer is cut in half and the risk of other cancers decreases significantly.  After 15 years, the risk of coronary heart disease drops, usually to the level of a nonsmoker.  If you are pregnant, quitting smoking will improve your chances of having a healthy baby.  The people you live with, especially any children, will be healthier.  You will have extra money to spend on things other than cigarettes. QUESTIONS TO THINK ABOUT BEFORE ATTEMPTING TO QUIT You may want to talk about your answers with your health care provider.  Why do you want to quit?  If you tried to quit in the past, what helped and what did not?  What will be the most difficult situations for you after you quit? How will you plan to handle them?  Who can help you through the tough times? Your family? Friends? A health care provider?  What pleasures do you get from smoking? What ways can you still get pleasure if you quit? Here are some questions to ask your health care provider:  How can you help me to be successful at quitting?  What medicine do you think would be best for me and how should I take it?  What should I do if I need more help?  What is smoking withdrawal like? How can I get information on withdrawal? GET READY  Set a quit date.  Change your environment by getting rid of all cigarettes, ashtrays, matches, and lighters in your home, car, or work. Do not let people smoke in your home.  Review your past attempts to quit. Think about what worked and what did not. GET SUPPORT AND ENCOURAGEMENT You have a better chance of being successful if you have help. You can get support in many ways.  Tell your family, friends, and  coworkers that you are going to quit and need their support. Ask them not to smoke around you.  Get individual, group, or telephone counseling and support. Programs are available at [employer] and health centers. Call your local health department for information about programs in your area.  Spiritual beliefs and practices may help some smokers quit.  Download a "quit meter" on your computer to keep track of quit statistics, such as how long you  have gone without smoking, cigarettes not smoked, and money saved.  Get a self-help book about quitting smoking and staying off tobacco. LEARN NEW SKILLS AND BEHAVIORS  Distract yourself from urges to smoke. Talk to someone, go for a walk, or occupy your time with a task.  Change your normal routine. Take a different route to work. Drink tea instead of coffee. Eat breakfast in a different place.  Reduce your stress. Take a hot bath, exercise, or read a book.  Plan something enjoyable to do every day. Reward yourself for not smoking.  Explore interactive web-based programs that specialize in helping you quit. GET MEDICINE AND USE IT CORRECTLY Medicines can help you stop smoking and decrease the urge to smoke. Combining medicine with the above behavioral methods and support can greatly increase your chances of successfully quitting smoking.  Nicotine replacement therapy helps deliver nicotine to your body without the negative effects and risks of smoking. Nicotine replacement therapy includes nicotine gum, lozenges, inhalers, nasal sprays, and skin patches. Some may be available over-the-counter and others require a prescription.  Antidepressant medicine helps people abstain from smoking, but how this works is unknown. This medicine is available by prescription.  Nicotinic receptor partial agonist medicine simulates the effect of nicotine in your brain. This medicine is available by prescription. Ask your health care provider for advice about  which medicines to use and how to use them based on your health history. Your health care provider will tell you what side effects to look out for if you choose to be on a medicine or therapy. Carefully read the information on the package. Do not use any other product containing nicotine while using a nicotine replacement product.  RELAPSE OR DIFFICULT SITUATIONS Most relapses occur within the first 3 months after quitting. Do not be discouraged if you start smoking again. Remember, most people try several times before finally quitting. You may have symptoms of withdrawal because your body is used to nicotine. You may crave cigarettes, be irritable, feel very hungry, cough often, get headaches, or have difficulty concentrating. The withdrawal symptoms are only temporary. They are strongest when you first quit, but they will go away within 10-14 days. To reduce the chances of relapse, try to:  Avoid drinking alcohol. Drinking lowers your chances of successfully quitting.  Reduce the amount of caffeine you consume. Once you quit smoking, the amount of caffeine in your body increases and can give you symptoms, such as a rapid heartbeat, sweating, and anxiety.  Avoid smokers because they can make you want to smoke.  Do not let weight gain distract you. Many smokers will gain weight when they quit, usually less than 10 pounds. Eat a healthy diet and stay active. You can always lose the weight gained after you quit.  Find ways to improve your mood other than smoking. FOR MORE INFORMATION  www.smokefree.gov  Document Released: 08/15/2001 Document Revised: 01/05/2014 Document Reviewed: 11/30/2011 [facility] Patient Information 2015 [place], [place]. This information is not intended to replace advice given to you by your health care provider. Make sure you discuss any questions you have with your health care provider.

## 2014-10-30 NOTE — ED Notes (Signed)
Pt using home 02 at night at 2L

## 2015-01-15 ENCOUNTER — Emergency Department (HOSPITAL_COMMUNITY)
Admission: EM | Admit: 2015-01-15 | Discharge: 2015-01-15 | Disposition: A | Discharge status: Home / Self Care | Payer: Medicaid Other | Attending: Emergency Medicine | Admitting: Emergency Medicine

## 2015-01-15 ENCOUNTER — Encounter (HOSPITAL_COMMUNITY): Payer: Self-pay | Admitting: *Deleted

## 2015-01-15 ENCOUNTER — Emergency Department (HOSPITAL_COMMUNITY): Payer: Medicaid Other

## 2015-01-15 DIAGNOSIS — I959 Hypotension, unspecified: Secondary | ICD-10-CM | POA: Diagnosis not present

## 2015-01-15 DIAGNOSIS — Z720 Tobacco use: Secondary | ICD-10-CM | POA: Diagnosis not present

## 2015-01-15 DIAGNOSIS — J441 Chronic obstructive pulmonary disease with (acute) exacerbation: Secondary | ICD-10-CM | POA: Insufficient documentation

## 2015-01-15 DIAGNOSIS — Z7952 Long term (current) use of systemic steroids: Secondary | ICD-10-CM | POA: Insufficient documentation

## 2015-01-15 DIAGNOSIS — E079 Disorder of thyroid, unspecified: Secondary | ICD-10-CM | POA: Insufficient documentation

## 2015-01-15 DIAGNOSIS — Z79899 Other long term (current) drug therapy: Secondary | ICD-10-CM | POA: Diagnosis not present

## 2015-01-15 DIAGNOSIS — R509 Fever, unspecified: Secondary | ICD-10-CM | POA: Diagnosis present

## 2015-01-15 DIAGNOSIS — J209 Acute bronchitis, unspecified: Secondary | ICD-10-CM

## 2015-01-15 LAB — BASIC METABOLIC PANEL
Anion gap: 8 (ref 5–15)
BUN: 10 mg/dL (ref 6–20)
CALCIUM: 8.6 mg/dL — AB (ref 8.9–10.3)
CO2: 26 mmol/L (ref 22–32)
CREATININE: 1.18 mg/dL (ref 0.61–1.24)
Chloride: 100 mmol/L — ABNORMAL LOW (ref 101–111)
Glucose, Bld: 116 mg/dL — ABNORMAL HIGH (ref 65–99)
POTASSIUM: 4.1 mmol/L (ref 3.5–5.1)
Sodium: 134 mmol/L — ABNORMAL LOW (ref 135–145)

## 2015-01-15 LAB — CBC WITH DIFFERENTIAL/PLATELET
Basophils Absolute: 0 10*3/uL (ref 0.0–0.1)
Basophils Relative: 0 % (ref 0–1)
Eosinophils Absolute: 0 10*3/uL (ref 0.0–0.7)
Eosinophils Relative: 0 % (ref 0–5)
HCT: 38.8 % — ABNORMAL LOW (ref 39.0–52.0)
HEMOGLOBIN: 12.8 g/dL — AB (ref 13.0–17.0)
Lymphocytes Relative: 10 % — ABNORMAL LOW (ref 12–46)
Lymphs Abs: 0.6 10*3/uL — ABNORMAL LOW (ref 0.7–4.0)
MCH: 28.5 pg (ref 26.0–34.0)
MCHC: 33 g/dL (ref 30.0–36.0)
MCV: 86.4 fL (ref 78.0–100.0)
Monocytes Absolute: 0.3 10*3/uL (ref 0.1–1.0)
Monocytes Relative: 5 % (ref 3–12)
NEUTROS ABS: 5.3 10*3/uL (ref 1.7–7.7)
NEUTROS PCT: 85 % — AB (ref 43–77)
Platelets: 117 10*3/uL — ABNORMAL LOW (ref 150–400)
RBC: 4.49 MIL/uL (ref 4.22–5.81)
RDW: 13.7 % (ref 11.5–15.5)
WBC: 6.2 10*3/uL (ref 4.0–10.5)

## 2015-01-15 LAB — I-STAT CG4 LACTIC ACID, ED: Lactic Acid, Venous: 1.71 mmol/L (ref 0.5–2.0)

## 2015-01-15 MED ORDER — LEVOFLOXACIN IN D5W 750 MG/150ML IV SOLN
750.0000 mg | Freq: Once | INTRAVENOUS | Status: AC
  Administered 2015-01-15: 750 mg via INTRAVENOUS
  Filled 2015-01-15: qty 150

## 2015-01-15 MED ORDER — SODIUM CHLORIDE 0.9 % IV SOLN
INTRAVENOUS | Status: DC
  Administered 2015-01-15: 19:00:00 via INTRAVENOUS

## 2015-01-15 MED ORDER — SODIUM CHLORIDE 0.9 % IV BOLUS (SEPSIS)
500.0000 mL | Freq: Once | INTRAVENOUS | Status: AC
  Administered 2015-01-15: 1000 mL via INTRAVENOUS

## 2015-01-15 NOTE — ED Notes (Signed)
Discharge instructions given, pt demonstrated teach back and verbal understanding. No concerns voiced.  

## 2015-01-15 NOTE — ED Notes (Signed)
Walked pt around unit per EDP instruction. O2 sat 88% on room air, bp 134/60 after ambulation. EDP notified.

## 2015-01-15 NOTE — Discharge Instructions (Signed)
Start taking your antibiotic and steroid medicine as soon as possible.   Acute Bronchitis Bronchitis is inflammation of the airways that extend from the windpipe into the lungs (bronchi). The inflammation often causes mucus to develop. This leads to a cough, which is the most common symptom of bronchitis.  In acute bronchitis, the condition usually develops suddenly and goes away over time, usually in a couple weeks. Smoking, allergies, and asthma can make bronchitis worse. Repeated episodes of bronchitis may cause further lung problems.  CAUSES Acute bronchitis is most often caused by the same virus that causes a cold. The virus can spread from person to person (contagious) through coughing, sneezing, and touching contaminated objects. SIGNS AND SYMPTOMS   Cough.   Fever.   Coughing up mucus.   Body aches.   Chest congestion.   Chills.   Shortness of breath.   Sore throat.  DIAGNOSIS  Acute bronchitis is usually diagnosed through a physical exam. Your health care provider will also ask you questions about your medical history. Tests, such as chest X-rays, are sometimes done to rule out other conditions.  TREATMENT  Acute bronchitis usually goes away in a couple weeks. Oftentimes, no medical treatment is necessary. Medicines are sometimes given for relief of fever or cough. Antibiotic medicines are usually not needed but may be prescribed in certain situations. In some cases, an inhaler may be recommended to help reduce shortness of breath and control the cough. A cool mist vaporizer may also be used to help thin bronchial secretions and make it easier to clear the chest.  HOME CARE INSTRUCTIONS  Get plenty of rest.   Drink enough fluids to keep your urine clear or pale yellow (unless you have a medical condition that requires fluid restriction). Increasing fluids may help thin your respiratory secretions (sputum) and reduce chest congestion, and it will prevent dehydration.    Take medicines only as directed by your health care provider.  If you were prescribed an antibiotic medicine, finish it all even if you start to feel better.  Avoid smoking and secondhand smoke. Exposure to cigarette smoke or irritating chemicals will make bronchitis worse. If you are a smoker, consider using nicotine gum or skin patches to help control withdrawal symptoms. Quitting smoking will help your lungs heal faster.   Reduce the chances of another bout of acute bronchitis by washing your hands frequently, avoiding people with cold symptoms, and trying not to touch your hands to your mouth, nose, or eyes.   Keep all follow-up visits as directed by your health care provider.  SEEK MEDICAL CARE IF: Your symptoms do not improve after 1 week of treatment.  SEEK IMMEDIATE MEDICAL CARE IF:  You develop an increased fever or chills.   You have chest pain.   You have severe shortness of breath.  You have bloody sputum.   You develop dehydration.  You faint or repeatedly feel like you are going to pass out.  You develop repeated vomiting.  You develop a severe headache. MAKE SURE YOU:   Understand these instructions.  Will watch your condition.  Will get help right away if you are not doing well or get worse. Document Released: 09/28/2004 Document Revised: 01/05/2014 Document Reviewed: 02/11/2013 [facility] Patient Information 2015 [place], [place]. This information is not intended to replace advice given to you by your health care provider. Make sure you discuss any questions you have with your health care provider.  Smoking Cessation Quitting smoking is important to your health and  has many advantages. However, it is not always easy to quit since nicotine is a very addictive drug. Oftentimes, people try 3 times or more before being able to quit. This document explains the best ways for you to prepare to quit smoking. Quitting takes hard work and a lot of effort, but you  can do it. ADVANTAGES OF QUITTING SMOKING  You will live longer, feel better, and live better.  Your body will feel the impact of quitting smoking almost immediately.  Within 20 minutes, blood pressure decreases. Your pulse returns to its normal level.  After 8 hours, carbon monoxide levels in the blood return to normal. Your oxygen level increases.  After 24 hours, the chance of having a heart attack starts to decrease. Your breath, hair, and body stop smelling like smoke.  After 48 hours, damaged nerve endings begin to recover. Your sense of taste and smell improve.  After 72 hours, the body is virtually free of nicotine. Your bronchial tubes relax and breathing becomes easier.  After 2 to 12 weeks, lungs can hold more air. Exercise becomes easier and circulation improves.  The risk of having a heart attack, stroke, cancer, or lung disease is greatly reduced.  After 1 year, the risk of coronary heart disease is cut in half.  After 5 years, the risk of stroke falls to the same as a nonsmoker.  After 10 years, the risk of lung cancer is cut in half and the risk of other cancers decreases significantly.  After 15 years, the risk of coronary heart disease drops, usually to the level of a nonsmoker.  If you are pregnant, quitting smoking will improve your chances of having a healthy baby.  The people you live with, especially any children, will be healthier.  You will have extra money to spend on things other than cigarettes. QUESTIONS TO THINK ABOUT BEFORE ATTEMPTING TO QUIT You may want to talk about your answers with your health care provider.  Why do you want to quit?  If you tried to quit in the past, what helped and what did not?  What will be the most difficult situations for you after you quit? How will you plan to handle them?  Who can help you through the tough times? Your family? Friends? A health care provider?  What pleasures do you get from smoking? What ways  can you still get pleasure if you quit? Here are some questions to ask your health care provider:  How can you help me to be successful at quitting?  What medicine do you think would be best for me and how should I take it?  What should I do if I need more help?  What is smoking withdrawal like? How can I get information on withdrawal? GET READY  Set a quit date.  Change your environment by getting rid of all cigarettes, ashtrays, matches, and lighters in your home, car, or work. Do not let people smoke in your home.  Review your past attempts to quit. Think about what worked and what did not. GET SUPPORT AND ENCOURAGEMENT You have a better chance of being successful if you have help. You can get support in many ways.  Tell your family, friends, and coworkers that you are going to quit and need their support. Ask them not to smoke around you.  Get individual, group, or telephone counseling and support. Programs are available at [facility] and [facility]. Call your local health department for information about programs in  your area.  Spiritual beliefs and practices may help some smokers quit.  Download a "quit meter" on your computer to keep track of quit statistics, such as how long you have gone without smoking, cigarettes not smoked, and money saved.  Get a self-help book about quitting smoking and staying off tobacco. LEARN NEW SKILLS AND BEHAVIORS  Distract yourself from urges to smoke. Talk to someone, go for a walk, or occupy your time with a task.  Change your normal routine. Take a different route to work. Drink tea instead of coffee. Eat breakfast in a different place.  Reduce your stress. Take a hot bath, exercise, or read a book.  Plan something enjoyable to do every day. Reward yourself for not smoking.  Explore interactive web-based programs that specialize in helping you quit. GET MEDICINE AND USE IT CORRECTLY Medicines can help you stop smoking and  decrease the urge to smoke. Combining medicine with the above behavioral methods and support can greatly increase your chances of successfully quitting smoking.  Nicotine replacement therapy helps deliver nicotine to your body without the negative effects and risks of smoking. Nicotine replacement therapy includes nicotine gum, lozenges, inhalers, nasal sprays, and skin patches. Some may be available over-the-counter and others require a prescription.  Antidepressant medicine helps people abstain from smoking, but how this works is unknown. This medicine is available by prescription.  Nicotinic receptor partial agonist medicine simulates the effect of nicotine in your brain. This medicine is available by prescription. Ask your health care provider for advice about which medicines to use and how to use them based on your health history. Your health care provider will tell you what side effects to look out for if you choose to be on a medicine or therapy. Carefully read the information on the package. Do not use any other product containing nicotine while using a nicotine replacement product.  RELAPSE OR DIFFICULT SITUATIONS Most relapses occur within the first 3 months after quitting. Do not be discouraged if you start smoking again. Remember, most people try several times before finally quitting. You may have symptoms of withdrawal because your body is used to nicotine. You may crave cigarettes, be irritable, feel very hungry, cough often, get headaches, or have difficulty concentrating. The withdrawal symptoms are only temporary. They are strongest when you first quit, but they will go away within 10-14 days. To reduce the chances of relapse, try to:  Avoid drinking alcohol. Drinking lowers your chances of successfully quitting.  Reduce the amount of caffeine you consume. Once you quit smoking, the amount of caffeine in your body increases and can give you symptoms, such as a rapid heartbeat,  sweating, and anxiety.  Avoid smokers because they can make you want to smoke.  Do not let weight gain distract you. Many smokers will gain weight when they quit, usually less than 10 pounds. Eat a healthy diet and stay active. You can always lose the weight gained after you quit.  Find ways to improve your mood other than smoking. FOR MORE INFORMATION  www.smokefree.gov  Document Released: 08/15/2001 Document Revised: 01/05/2014 Document Reviewed: 11/30/2011 [facility] Patient Information 2015 [place], [place]. This information is not intended to replace advice given to you by your health care provider. Make sure you discuss any questions you have with your health care provider.  Smoking Hazards Smoking cigarettes is extremely bad for your health. Tobacco smoke has over 200 known poisons in it. It contains the poisonous gases nitrogen oxide and carbon monoxide. There  are over 60 chemicals in tobacco smoke that cause cancer. Some of the chemicals found in cigarette smoke include:   Cyanide.   Benzene.   Formaldehyde.   Methanol (wood alcohol).   Acetylene (fuel used in welding torches).   Ammonia.  Even smoking lightly shortens your life expectancy by several years. You can greatly reduce the risk of medical problems for you and your family by stopping now. Smoking is the most preventable cause of death and disease in our society. Within days of quitting smoking, your circulation improves, you decrease the risk of having a heart attack, and your lung capacity improves. There may be some increased phlegm in the first few days after quitting, and it may take months for your lungs to clear up completely. Quitting for 10 years reduces your risk of developing lung cancer to almost that of a nonsmoker.  WHAT ARE THE RISKS OF SMOKING? Cigarette smokers have an increased risk of many serious medical problems, including:  Lung cancer.   Lung disease (such as pneumonia, bronchitis, and  emphysema).   Heart attack and chest pain due to the heart not getting enough oxygen (angina).   Heart disease and peripheral blood vessel disease.   Hypertension.   Stroke.   Oral cancer (cancer of the lip, mouth, or voice box).   Bladder cancer.   Pancreatic cancer.   Cervical cancer.   Pregnancy complications, including premature birth.   Stillbirths and smaller newborn babies, birth defects, and genetic damage to sperm.   Early menopause.   Lower estrogen level for women.   Infertility.   Facial wrinkles.   Blindness.   Increased risk of broken bones (fractures).   Senile dementia.   Stomach ulcers and internal bleeding.   Delayed wound healing and increased risk of complications during surgery. Because of secondhand smoke exposure, children of smokers have an increased risk of the following:   Sudden infant death syndrome (SIDS).   Respiratory infections.   Lung cancer.   Heart disease.   Ear infections.  WHY IS SMOKING ADDICTIVE? Nicotine is the chemical agent in tobacco that is capable of causing addiction or dependence. When you smoke and inhale, nicotine is absorbed rapidly into the bloodstream through your lungs. Both inhaled and noninhaled nicotine may be addictive.  WHAT ARE THE BENEFITS OF QUITTING?  There are many health benefits to quitting smoking. Some are:   The likelihood of developing cancer and heart disease decreases. Health improvements are seen almost immediately.   Blood pressure, pulse rate, and breathing patterns start returning to normal soon after quitting.   People who quit may see an improvement in their overall quality of life.  HOW DO YOU QUIT SMOKING? Smoking is an addiction with both physical and psychological effects, and longtime habits can be hard to change. Your health care provider can recommend:  Programs and community resources, which may include group support, education, or  therapy.  Replacement products, such as patches, gum, and nasal sprays. Use these products only as directed. Do not replace cigarette smoking with electronic cigarettes (commonly called e-cigarettes). The safety of e-cigarettes is unknown, and some may contain harmful chemicals. FOR MORE INFORMATION  American Lung Association: www.lung.org  American Cancer Society: www.cancer.org Document Released: 09/28/2004 Document Revised: 06/11/2013 Document Reviewed: 02/10/2013 [facility] Patient Information 2015 [place], [place]. This information is not intended to replace advice given to you by your health care provider. Make sure you discuss any questions you have with your health care provider.

## 2015-01-15 NOTE — ED Provider Notes (Signed)
CSN: [id number]     Arrival date & time 01/15/15  1801 History   First MD Initiated Contact with Patient 01/15/15 1834     Chief Complaint  Patient presents with  . Fever     (Consider location/radiation/quality/duration/timing/severity/associated sxs/prior Treatment) Patient is a 54 y.o. male presenting with fever. The history is provided by the patient.  Fever   Steven Larson is a 54 y.o. male who presents for evaluation of weakness, cough, clear sputum production, shortness of breath and continued tobacco abuse. He saw his PCP yesterday who prescribed an antibiotic and steroid. He has not started these medicines yet. He thinks that these medicines have been delivered to his house while he is here in the emergency department. He went again to his doctor this morning, and later decided to come here. He denies headache, nausea, vomiting or change in his chronic back pain. He uses oxygen at nighttime. There are no other known modifying factors.    Past Medical History  Diagnosis Date  . COPD (chronic obstructive pulmonary disease)   . Emphysema   . Thyroid disease    Past Surgical History  Procedure Laterality Date  . Back surgery     No family history on file. History  Substance Use Topics  . Smoking status: Current Every Day Smoker    Types: Cigarettes  . Smokeless tobacco: Not on file  . Alcohol Use: No    Review of Systems  Constitutional: Positive for fever.  All other systems reviewed and are negative.     Allergies  Review of patient's allergies indicates no known allergies.  Home Medications   Prior to Admission medications   Medication Sig Start Date End Date Taking? Authorizing Provider  albuterol (PROVENTIL HFA;VENTOLIN HFA) 108 (90 BASE) MCG/ACT inhaler Inhale 2 puffs into the lungs every 4 (four) hours as needed for wheezing or shortness of breath. 10/30/14  Yes Kristen N Ward, DO  ALPRAZolam (XANAX) 1 MG tablet Take 1 mg by mouth 3 (three) times daily.    Yes Historical Provider, MD  cetirizine (ZYRTEC) 10 MG tablet Take 10 mg by mouth every morning.   Yes Historical Provider, MD  levETIRAcetam (KEPPRA) 500 MG tablet Take 500 mg by mouth at bedtime.    Yes Historical Provider, MD  levothyroxine (SYNTHROID, LEVOTHROID) 88 MCG tablet Take 88 mcg by mouth daily before breakfast.   Yes Historical Provider, MD  methadone (DOLOPHINE) 10 MG tablet Take 10 mg by mouth every 8 (eight) hours.   Yes Historical Provider, MD  omeprazole (PRILOSEC) 20 MG capsule Take 20 mg by mouth daily.   Yes Historical Provider, MD  pregabalin (LYRICA) 75 MG capsule Take 75-150 mg by mouth at bedtime.    Yes Historical Provider, MD  predniSONE (DELTASONE) 20 MG tablet Take 3 tablets (60 mg total) by mouth daily. Patient not taking: Reported on 01/15/2015 10/30/14   Kristen N Ward, DO   BP 134/60 mmHg  Pulse 74  Temp(Src) 99.3 F (37.4 C) (Oral)  Resp 17  Ht  (1.854 m)  Wt 215 lb (97.523 kg)  BMI 28.37 kg/m2  SpO2 88% Physical Exam  Constitutional: He is oriented to person, place, and time. He appears well-developed.  Appears older than stated age  HENT:  Head: Normocephalic and atraumatic.  Right Ear: External ear normal.  Left Ear: External ear normal.  Eyes: Conjunctivae and EOM are normal. Pupils are equal, round, and reactive to light.  Neck: Normal range of motion and phonation normal.  Neck supple.  Cardiovascular: Normal rate, regular rhythm and normal heart sounds.   Mild hypotension, initially.  Pulmonary/Chest: Effort normal. He exhibits no bony tenderness.  Decreased air movement bilaterally with scattered rhonchi and wheezes. No increased work of breathing.  Abdominal: Soft. There is no tenderness.  Musculoskeletal: Normal range of motion. He exhibits no edema or tenderness.  Neurological: He is alert and oriented to person, place, and time. No cranial nerve deficit or sensory deficit. He exhibits normal muscle tone. Coordination normal.  Skin:  Skin is warm, dry and intact.  Psychiatric: He has a normal mood and affect. His behavior is normal. Judgment and thought content normal.  Nursing note and vitals reviewed.   ED Course  Procedures (including critical care time)  Medications  0.9 %  sodium chloride infusion ( Intravenous Stopped 01/15/15 2153)  sodium chloride 0.9 % bolus 500 mL (0 mLs Intravenous Stopped 01/15/15 2059)  levofloxacin (LEVAQUIN) IVPB 750 mg (0 mg Intravenous Stopped 01/15/15 2047)    Patient Vitals for the past 24 hrs:  BP Temp Temp src Pulse Resp SpO2 Height Weight  01/15/15 2148 134/60 mmHg - - - - (!) 88 % - -  01/15/15 2000 95/66 mmHg - - 74 17 94 % - -  01/15/15 1930 93/69 mmHg - - 82 18 95 % - -  01/15/15 1900 93/59 mmHg - - 82 14 91 % - -  01/15/15 1830 96/63 mmHg 99.3 F (37.4 C) Oral 86 14 91 % 6\' 1"  (1.854 m) 215 lb (97.523 kg)    10:03 PM Reevaluation with update and discussion. After initial assessment and treatment, an updated evaluation reveals no additional complaints. Vital signs stable. He is able to cannulate easily. Findings discussed. Patient, all questions answered.Steven Bale. Steven Larson    Labs Review Labs Reviewed  CBC WITH DIFFERENTIAL/PLATELET - Abnormal; Notable for the following:    Hemoglobin 12.8 (*)    HCT 38.8 (*)    Platelets 117 (*)    Neutrophils Relative % 85 (*)    Lymphocytes Relative 10 (*)    Lymphs Abs 0.6 (*)    All other components within normal limits  BASIC METABOLIC PANEL - Abnormal; Notable for the following:    Sodium 134 (*)    Chloride 100 (*)    Glucose, Bld 116 (*)    Calcium 8.6 (*)    All other components within normal limits  CULTURE, BLOOD (ROUTINE X 2)  CULTURE, BLOOD (ROUTINE X 2)  I-STAT CG4 LACTIC ACID, ED    Imaging Review Dg Chest 2 View  01/15/2015   CLINICAL DATA:  Fever, cough and congestion with weakness a few days getting worse.  EXAM: CHEST  2 VIEW  COMPARISON:  10/30/2014 and 10/08/2014  FINDINGS: Lungs are adequately inflated  demonstrate mild emphysematous disease over the upper lungs. Minimal prominence of the infrahilar markings unchanged. No definite effusion. Cardiomediastinal silhouette and remainder the exam is unchanged.  IMPRESSION: Possible minimal vascular congestion.  Mild emphysematous disease.   Electronically Signed   By: Elberta Fortisaniel  Boyle M.D.   On: 01/15/2015 20:56     EKG Interpretation   Date/Time:  Friday Jan 15 2015 18:46:24 EDT Ventricular Rate:  82 PR Interval:  140 QRS Duration: 91 QT Interval:  380 QTC Calculation: 444 R Axis:   59 Text Interpretation:  Sinus rhythm since last tracing no significant  change Confirmed by Effie ShyWENTZ  MD, Avanelle Pixley [phone]([id number] on 01/15/2015 7:10:27 PM      MDM   Final  diagnoses:  Acute bronchitis, unspecified organism  Tobacco abuse    Evaluation is consistent with acute bronchitis without evidence for pneumonia, metabolic instability or suggestion for impending vascular collapse.   Nursing Notes Reviewed/ Care Coordinated Applicable Imaging Reviewed Interpretation of Laboratory Data incorporated into ED treatment  The patient appears reasonably screened and/or stabilized for discharge and I doubt any other medical condition or other [facility] requiring further screening, evaluation, or treatment in the ED at this time prior to discharge.  Plan: Home Medications- ABX and steroid as per PCP; Home Treatments- stop smoking; return here if the recommended treatment, does not improve the symptoms; Recommended follow up- PCP 1 week and prn     Steven BaleElliott Takai Chiaramonte, MD 01/15/15 2205

## 2015-01-15 NOTE — ED Notes (Signed)
Fever, Cough  And SOB at times. Pt states he was seen by PCP this morning who was wanting to admit him but he refused at the time.

## 2015-01-20 LAB — CULTURE, BLOOD (ROUTINE X 2)
CULTURE: NO GROWTH
Culture: NO GROWTH

## 2015-03-11 LAB — TSH: TSH: 4.18 u[IU]/mL (ref <=5.90)

## 2015-05-21 ENCOUNTER — Encounter: Payer: Self-pay | Admitting: "Endocrinology

## 2015-06-03 ENCOUNTER — Ambulatory Visit (INDEPENDENT_AMBULATORY_CARE_PROVIDER_SITE_OTHER): Payer: Medicaid Other | Admitting: "Endocrinology

## 2015-06-03 ENCOUNTER — Encounter: Payer: Self-pay | Admitting: "Endocrinology

## 2015-06-03 ENCOUNTER — Ambulatory Visit (HOSPITAL_COMMUNITY)
Admission: RE | Admit: 2015-06-03 | Discharge: 2015-06-03 | Disposition: A | Discharge status: Home / Self Care | Payer: Medicaid Other | Source: Ambulatory Visit | Attending: Family Medicine | Admitting: Family Medicine

## 2015-06-03 ENCOUNTER — Other Ambulatory Visit (HOSPITAL_COMMUNITY): Payer: Self-pay | Admitting: Family Medicine

## 2015-06-03 VITALS — BP 117/84 | HR 76 | Ht 71.0 in | Wt 212.0 lb

## 2015-06-03 DIAGNOSIS — R7303 Prediabetes: Secondary | ICD-10-CM

## 2015-06-03 DIAGNOSIS — S6992XA Unspecified injury of left wrist, hand and finger(s), initial encounter: Secondary | ICD-10-CM | POA: Diagnosis not present

## 2015-06-03 DIAGNOSIS — T148XXA Other injury of unspecified body region, initial encounter: Secondary | ICD-10-CM

## 2015-06-03 DIAGNOSIS — R7309 Other abnormal glucose: Secondary | ICD-10-CM

## 2015-06-03 DIAGNOSIS — E032 Hypothyroidism due to medicaments and other exogenous substances: Secondary | ICD-10-CM | POA: Diagnosis not present

## 2015-06-03 DIAGNOSIS — M79642 Pain in left hand: Secondary | ICD-10-CM | POA: Diagnosis present

## 2015-06-03 DIAGNOSIS — E890 Postprocedural hypothyroidism: Secondary | ICD-10-CM | POA: Insufficient documentation

## 2015-06-03 MED ORDER — LEVOTHYROXINE SODIUM 100 MCG PO TABS: 100.0000 ug | ORAL_TABLET | Freq: Every day | ORAL | Status: DC

## 2015-06-03 NOTE — Progress Notes (Signed)
Subjective:    Patient ID: Steven Larson, male    DOB: 1961/07/19,    Past Medical History  Diagnosis Date  . COPD (chronic obstructive pulmonary disease)   . Emphysema   . Thyroid disease   . Back pain   . Cholelithiasis   . CAD (coronary artery disease)   . Hypothyroidism due to medication   . Prediabetes    Past Surgical History  Procedure Laterality Date  . Back surgery    . Cholecystectomy     Social History   Social History  . Marital Status: Legally Separated    Spouse Name: N/A  . Number of Children: N/A  . Years of Education: N/A   Social History Main Topics  . Smoking status: Current Every Day Smoker    Types: Cigarettes  . Smokeless tobacco: None  . Alcohol Use: No  . Drug Use: No  . Sexual Activity: Not Asked   Other Topics Concern  . None   Social History Narrative   Outpatient Encounter Prescriptions as of 06/03/2015  Medication Sig  . albuterol (PROVENTIL HFA;VENTOLIN HFA) 108 (90 BASE) MCG/ACT inhaler Inhale 2 puffs into the lungs every 4 (four) hours as needed for wheezing or shortness of breath.  . ALPRAZolam (XANAX) 1 MG tablet Take 1 mg by mouth 3 (three) times daily.  . cetirizine (ZYRTEC) 10 MG tablet Take 10 mg by mouth every morning.  Marland Kitchen esomeprazole (NEXIUM) 40 MG capsule Take 40 mg by mouth daily at 12 noon.  Marland Kitchen levothyroxine (SYNTHROID, LEVOTHROID) 100 MCG tablet Take 1 tablet (100 mcg total) by mouth daily before breakfast.  . methadone (DOLOPHINE) 10 MG tablet Take 10 mg by mouth every 8 (eight) hours.  . pregabalin (LYRICA) 75 MG capsule Take 75-150 mg by mouth at bedtime.   . [DISCONTINUED] levETIRAcetam (KEPPRA) 500 MG tablet Take 500 mg by mouth at bedtime.   . [DISCONTINUED] levothyroxine (SYNTHROID, LEVOTHROID) 88 MCG tablet Take 88 mcg by mouth daily before breakfast.  . metFORMIN (GLUCOPHAGE) 500 MG tablet Take 500 mg by mouth daily.  . [DISCONTINUED] omeprazole (PRILOSEC) 20 MG capsule Take 20 mg by mouth daily.  .  [DISCONTINUED] predniSONE (DELTASONE) 20 MG tablet Take 3 tablets (60 mg total) by mouth daily. (Patient not taking: Reported on 01/15/2015)   No facility-administered encounter medications on file as of 06/03/2015.   ALLERGIES: No Known Allergies VACCINATION STATUS:  There is no immunization history on file for this patient.  HPI  54 yr old pt , s/p RAI for GD. He is here for f/u. he is on LT4 88 mcg. He continued to feel better, lost some weight . no palpitations, no heat/cold intolerance. he c/o cough, continued to smoke heavily.  Review of Systems   Constitutional: +weight loss, no fatigue, no subjective hyperthermia/hypothermia Eyes: no blurry vision, no xerophthalmia ENT: no sore throat, no nodules palpated in throat, no dysphagia/odynophagia, no hoarseness Cardiovascular: no CP/SOB/palpitations/leg swelling Respiratory: +cough/ +SOB Gastrointestinal: no N/V/D/C Musculoskeletal: no muscle/joint aches Skin: no rashes Neurological: no tremors/numbness/tingling/dizziness Psychiatric: no depression/anxiety  Objective:    BP 117/84 mmHg  Pulse 76  Ht  (1.803 m)  Wt 212 lb (96.163 kg)  BMI 29.58 kg/m2  SpO2 97%  Wt Readings from Last 3 Encounters:  06/03/15 212 lb (96.163 kg)  09/18/14 216 lb (97.977 kg)  01/15/15 215 lb (97.523 kg)    Physical Exam Constitutional: over weight, in NAD Eyes: PERRLA, EOMI, no exophthalmos ENT: moist mucous membranes, no thyromegaly,  no cervical lymphadenopathy Cardiovascular: RRR, No MRG Respiratory: scattered wheezes. Gastrointestinal: abdomen soft, NT, ND, BS+ Musculoskeletal: no deformities, strength intact in all 4 Skin: moist, warm, no rashes Neurological: no tremor with outstretched hands, DTR normal in all 4  Results for orders placed or performed in visit on 05/21/15  TSH  Result Value Ref Range   TSH 4.18 .41 - 5.90 uIU/mL   Complete Blood Count (Most recent): Lab Results  Component Value Date   WBC 6.2  01/15/2015   HGB 12.8* 01/15/2015   HCT 38.8* 01/15/2015   MCV 86.4 01/15/2015   PLT 117* 01/15/2015   Chemistry (most recent):   Assessment & Plan:   1. Hypothyroidism due to medication tSH is 4.18 with a ow normal free T4 of 1.2. I will increase his levothyroxine to  100 g by mouth every morning. I will obtain the following labs  Before his next visit. - T4, free - TSH  2. Pre-diabetes He is at risk of type 2 diabetes. I urged him to resume metformin 500 mg once a day. I will obtain Hemoglobin A1c before his next visit. He is advised to quit  Smoking.   Follow up plan: Return in about 4 months (around 10/03/2015) for underactive thyroid, with pre-visit labs.  Marquis Lunch, MD Phone: [phone]  Fax: [phone]   06/03/2015, 11:45 AM

## 2015-09-16 ENCOUNTER — Encounter (INDEPENDENT_AMBULATORY_CARE_PROVIDER_SITE_OTHER): Payer: Self-pay | Admitting: *Deleted

## 2015-09-24 ENCOUNTER — Other Ambulatory Visit: Payer: Self-pay | Admitting: "Endocrinology

## 2015-09-24 LAB — TSH: TSH: 0.627 u[IU]/mL (ref 0.350–4.500)

## 2015-09-24 LAB — T4, FREE: Free T4: 1.36 ng/dL (ref 0.80–1.80)

## 2015-09-25 LAB — HEMOGLOBIN A1C
Hgb A1c MFr Bld: 5.8 % — ABNORMAL HIGH (ref <5.7)
MEAN PLASMA GLUCOSE: 120 mg/dL — AB (ref <117)

## 2015-10-04 ENCOUNTER — Encounter: Payer: Self-pay | Admitting: "Endocrinology

## 2015-10-04 ENCOUNTER — Ambulatory Visit (INDEPENDENT_AMBULATORY_CARE_PROVIDER_SITE_OTHER): Payer: Medicaid Other | Admitting: "Endocrinology

## 2015-10-04 VITALS — BP 132/79 | HR 63 | Ht 71.0 in | Wt 212.0 lb

## 2015-10-04 DIAGNOSIS — R7303 Prediabetes: Secondary | ICD-10-CM

## 2015-10-04 DIAGNOSIS — E032 Hypothyroidism due to medicaments and other exogenous substances: Secondary | ICD-10-CM

## 2015-10-04 MED ORDER — METFORMIN HCL 500 MG PO TABS: 500.0000 mg | ORAL_TABLET | Freq: Every day | ORAL | Status: DC

## 2015-10-04 MED ORDER — LEVOTHYROXINE SODIUM 100 MCG PO TABS: 100.0000 ug | ORAL_TABLET | Freq: Every day | ORAL | Status: DC

## 2015-10-04 NOTE — Progress Notes (Signed)
Subjective:    Patient ID: Steven Larson, male    DOB: [birth year],    Past Medical History  Diagnosis Date  . COPD (chronic obstructive pulmonary disease) (HCC)   . Emphysema   . Thyroid disease   . Back pain   . Cholelithiasis   . CAD (coronary artery disease)   . Hypothyroidism due to medication   . Prediabetes    Past Surgical History  Procedure Laterality Date  . Back surgery    . Cholecystectomy     Social History   Social History  . Marital Status: Legally Separated    Spouse Name: N/A  . Number of Children: N/A  . Years of Education: N/A   Social History Main Topics  . Smoking status: Current Every Day Smoker    Types: Cigarettes  . Smokeless tobacco: None  . Alcohol Use: No  . Drug Use: No  . Sexual Activity: Not Asked   Other Topics Concern  . None   Social History Narrative   Outpatient Encounter Prescriptions as of 10/04/2015  Medication Sig  . albuterol (PROVENTIL HFA;VENTOLIN HFA) 108 (90 BASE) MCG/ACT inhaler Inhale 2 puffs into the lungs every 4 (four) hours as needed for wheezing or shortness of breath.  . ALPRAZolam (XANAX) 1 MG tablet Take 1 mg by mouth 3 (three) times daily.  . cetirizine (ZYRTEC) 10 MG tablet Take 10 mg by mouth every morning.  Marland Kitchen esomeprazole (NEXIUM) 40 MG capsule Take 40 mg by mouth daily at 12 noon.  Marland Kitchen levothyroxine (SYNTHROID, LEVOTHROID) 100 MCG tablet Take 1 tablet (100 mcg total) by mouth daily before breakfast.  . metFORMIN (GLUCOPHAGE) 500 MG tablet Take 1 tablet (500 mg total) by mouth daily.  . methadone (DOLOPHINE) 10 MG tablet Take 10 mg by mouth every 8 (eight) hours.  . pregabalin (LYRICA) 75 MG capsule Take 75-150 mg by mouth at bedtime.   . [DISCONTINUED] levothyroxine (SYNTHROID, LEVOTHROID) 100 MCG tablet Take 1 tablet (100 mcg total) by mouth daily before breakfast.  . [DISCONTINUED] metFORMIN (GLUCOPHAGE) 500 MG tablet Take 500 mg by mouth daily.   No facility-administered encounter medications on file  as of 10/04/2015.   ALLERGIES: No Known Allergies VACCINATION STATUS:  There is no immunization history on file for this patient.  HPI  55 yr old pt , s/p RAI for GD. He is here for f/u. he is on LT4 100 mcg. He continued to feel better, lost some weight . no palpitations, no heat/cold intolerance. he c/o cough, continued to smoke heavily. -He was started on low-dose metformin 500 mg by mouth every morning for prediabetes. He stayed on these medication. No side effects reported.  Review of Systems   Constitutional: - weight loss,  + fatigue, no subjective hyperthermia/hypothermia Eyes: no blurry vision, no xerophthalmia ENT: no sore throat, no nodules palpated in throat, no dysphagia/odynophagia, no hoarseness Cardiovascular: no CP/SOB/palpitations/leg swelling Respiratory: +cough/ +SOB Gastrointestinal: no N/V/D/C Musculoskeletal: no muscle/joint aches Skin: no rashes Neurological: no tremors/numbness/tingling/dizziness Psychiatric: no depression/anxiety  Objective:    BP 132/79 mmHg  Pulse 63  Ht  (1.803 m)  Wt 212 lb (96.163 kg)  BMI 29.58 kg/m2  SpO2 90%  Wt Readings from Last 3 Encounters:  10/04/15 212 lb (96.163 kg)  06/03/15 212 lb (96.163 kg)  09/18/14 216 lb (97.977 kg)    Physical Exam Constitutional: over weight, in NAD Eyes: PERRLA, EOMI, no exophthalmos ENT: moist mucous membranes, no thyromegaly, no cervical lymphadenopathy Cardiovascular: RRR, No MRG  Respiratory: scattered wheezes, His hypoxic with SPO2 of 90%. Gastrointestinal: abdomen soft, NT, ND, BS+ Musculoskeletal: no deformities, strength intact in all 4 Skin: moist, warm, no rashes Neurological: no tremor with outstretched hands, DTR normal in all 4  Results for orders placed or performed in visit on 09/24/15  TSH  Result Value Ref Range   TSH 0.627 0.350 - 4.500 uIU/mL  T4, free  Result Value Ref Range   Free T4 1.36 0.80 - 1.80 ng/dL  Hemoglobin B1Y  Result Value Ref Range    Hgb A1c MFr Bld 5.8 (H) <5.7 %   Mean Plasma Glucose 120 (H) <117 mg/dL   Complete Blood Count (Most recent): Lab Results  Component Value Date   WBC 6.2 01/15/2015   HGB 12.8* 01/15/2015   HCT 38.8* 01/15/2015   MCV 86.4 01/15/2015   PLT 117* 01/15/2015      Assessment & Plan:   1. Hypothyroidism due to medication -his thyroid function tests are consistent with appropriate replacement. I will  continue levothyroxine   100 g by mouth every morning.  - We discussed about correct intake of levothyroxine, at fasting, with water, separated by at least 30 minutes from breakfast, and separated by more than 4 hours from calcium, iron, multivitamins, acid reflux medications (PPIs). -Patient is made aware of the fact that thyroid hormone replacement is needed for life, dose to be adjusted by periodic monitoring of thyroid function tests. 2. Pre-diabetes He is at risk of type 2 diabetes. I urged him to continue metformin 500 mg once a day. He is A1c is better at 5.8%. -He has hypoxia with SPO2 of 90% , chronic active smoking likely COPD. He is advised to quit  Smoking. He will continue to follow up with his PMD for his other medical needs.   Follow up plan: Return in about 6 months (around 04/02/2016) for underactive thyroid, follow up with pre-visit labs.  Marquis Lunch, MD Phone: [phone]  Fax: [phone]   10/04/2015, 12:00 PM

## 2015-10-19 ENCOUNTER — Encounter (INDEPENDENT_AMBULATORY_CARE_PROVIDER_SITE_OTHER): Payer: Self-pay | Admitting: *Deleted

## 2015-10-19 ENCOUNTER — Other Ambulatory Visit (INDEPENDENT_AMBULATORY_CARE_PROVIDER_SITE_OTHER): Payer: Self-pay | Admitting: *Deleted

## 2015-10-19 ENCOUNTER — Other Ambulatory Visit (INDEPENDENT_AMBULATORY_CARE_PROVIDER_SITE_OTHER): Payer: Self-pay | Admitting: Internal Medicine

## 2015-10-19 ENCOUNTER — Telehealth (INDEPENDENT_AMBULATORY_CARE_PROVIDER_SITE_OTHER): Payer: Self-pay | Admitting: *Deleted

## 2015-10-19 DIAGNOSIS — Z1211 Encounter for screening for malignant neoplasm of colon: Secondary | ICD-10-CM

## 2015-10-19 NOTE — Telephone Encounter (Signed)
Patient needs trilyte 

## 2015-10-19 NOTE — Telephone Encounter (Signed)
agree

## 2015-10-19 NOTE — Telephone Encounter (Signed)
Referring MD/PCP: [email or web address]   Procedure: tcs with propofol  Reason/Indication:  screening  Has patient had this procedure before?  no  If so, when, by whom and where?    Is there a family history of colon cancer?  no  Who?  What age when diagnosed?    Is patient diabetic?   no      Does patient have prosthetic heart valve or mechanical valve?  no  Do you have a pacemaker?  no  Has patient ever had endocarditis? no  Has patient had joint replacement within last 12 months?  no  Does patient tend to be constipated or take laxatives? yes  Does patient have a history of alcohol/drug use?  no  Is patient on Coumadin, Plavix and/or Aspirin? no  Medications: methadone 10 mg tid, xanax 1 mg tid, kepra 500 mg bid, lyrica 75 mg bid, levothyroxine 100 mcg daily, metformin 500 mg daily  Allergies: codeine  Medication Adjustment:   Procedure date & time: 11/05/15 at 11:10 & pre-op 2/28 @ 1245

## 2015-10-20 MED ORDER — PEG 3350-KCL-NA BICARB-NACL 420 G PO SOLR: 4000.0000 mL | Freq: Once | ORAL | Status: DC

## 2015-11-02 ENCOUNTER — Encounter (HOSPITAL_COMMUNITY)
Admission: RE | Admit: 2015-11-02 | Discharge: 2015-11-02 | Disposition: A | Discharge status: Home / Self Care | Payer: Medicaid Other | Source: Ambulatory Visit | Attending: Internal Medicine | Admitting: Internal Medicine

## 2015-11-02 ENCOUNTER — Encounter (HOSPITAL_COMMUNITY): Payer: Self-pay

## 2015-11-02 DIAGNOSIS — Z01812 Encounter for preprocedural laboratory examination: Secondary | ICD-10-CM | POA: Diagnosis not present

## 2015-11-02 HISTORY — DX: Type 2 diabetes mellitus without complications: E11.9

## 2015-11-02 LAB — BASIC METABOLIC PANEL
ANION GAP: 7 (ref 5–15)
BUN: 8 mg/dL (ref 6–20)
CALCIUM: 8.8 mg/dL — AB (ref 8.9–10.3)
CO2: 30 mmol/L (ref 22–32)
Chloride: 104 mmol/L (ref 101–111)
Creatinine, Ser: 1.03 mg/dL (ref 0.61–1.24)
Glucose, Bld: 96 mg/dL (ref 65–99)
Potassium: 3.5 mmol/L (ref 3.5–5.1)
SODIUM: 141 mmol/L (ref 135–145)

## 2015-11-02 LAB — CBC
HCT: 40.8 % (ref 39.0–52.0)
Hemoglobin: 13.5 g/dL (ref 13.0–17.0)
MCH: 28.8 pg (ref 26.0–34.0)
MCHC: 33.1 g/dL (ref 30.0–36.0)
MCV: 87.2 fL (ref 78.0–100.0)
PLATELETS: 123 10*3/uL — AB (ref 150–400)
RBC: 4.68 MIL/uL (ref 4.22–5.81)
RDW: 13.8 % (ref 11.5–15.5)
WBC: 6.4 10*3/uL (ref 4.0–10.5)

## 2015-11-02 NOTE — Patient Instructions (Signed)
   Your procedure is scheduled on: 11/05/2015  Report to [facility] at  6:15   AM.  Call this number if you have problems the morning of surgery: [phone]   Remember:   Do not drink or eat food:After Midnight.  :  Take these medicines the morning of surgery with A SIP OF WATER: Albuterol, Zyrtec, Nexium, Levothyroxine and Keppra   Do not wear jewelry, make-up or nail polish.  Do not wear lotions, powders, or perfumes. You may wear deodorant.   Do not bring valuables to the hospital.  Contacts, dentures or bridgework may not be worn into surgery.  Leave suitcase in the car. After surgery it may be brought to your room.  For patients admitted to the hospital, checkout time is 11:00 AM the day of discharge.   Patients discharged the day of surgery will not be allowed to drive home.    Colonoscopy, Care After Refer to this sheet in the next few weeks. These instructions provide you with information on caring for yourself after your procedure. Your health care provider may also give you more specific instructions. Your treatment has been planned according to current medical practices, but problems sometimes occur. Call your health care provider if you have any problems or questions after your procedure. WHAT TO EXPECT AFTER THE PROCEDURE  After your procedure, it is typical to have the following:  A small amount of blood in your stool.  Moderate amounts of gas and mild abdominal cramping or bloating. HOME CARE INSTRUCTIONS  Do not drive, operate machinery, or sign important documents for 24 hours.  You may shower and resume your regular physical activities, but move at a slower pace for the first 24 hours.  Take frequent rest periods for the first 24 hours.  Walk around or put a warm pack on your abdomen to help reduce abdominal cramping and bloating.  Drink enough fluids to keep your urine clear or pale yellow.  You may resume your normal diet as instructed by your health care  provider. Avoid heavy or fried foods that are hard to digest.  Avoid drinking alcohol for 24 hours or as instructed by your health care provider.  Only take over-the-counter or prescription medicines as directed by your health care provider.  If a tissue sample (biopsy) was taken during your procedure:  Do not take aspirin or blood thinners for 7 days, or as instructed by your health care provider.  Do not drink alcohol for 7 days, or as instructed by your health care provider.  Eat soft foods for the first 24 hours. SEEK MEDICAL CARE IF: You have persistent spotting of blood in your stool 2-3 days after the procedure. SEEK IMMEDIATE MEDICAL CARE IF:  You have more than a small spotting of blood in your stool.  You pass large blood clots in your stool.  Your abdomen is swollen (distended).  You have nausea or vomiting.  You have a fever.  You have increasing abdominal pain that is not relieved with medicine.   This information is not intended to replace advice given to you by your health care provider. Make sure you discuss any questions you have with your health care provider.   Document Released: 04/04/2004 Document Revised: 06/11/2013 Document Reviewed: 04/28/2013 Elsevier Interactive Patient Education [employer]

## 2015-11-16 NOTE — Patient Instructions (Signed)
Steven Larson  11/16/2015     @PREFPERIOPPHARMACY @   Your procedure is scheduled on  11/19/2015   [facility]  [facility].M.  Call this number if you have problems the morning of surgery:  [phone]   Remember:  Do not eat food or drink liquids after midnight.  Take these medicines the morning of surgery with A SIP OF WATER  Xanax, zyrtec, nexium, keppra, levothyroxine, methadone, lyrica. Take your inhaler before you come.   Do not wear jewelry, make-up or nail polish.  Do not wear lotions, powders, or perfumes.  You may wear deodorant.  Do not shave 48 hours prior to surgery.  Men may shave face and neck.  Do not bring valuables to the hospital.  [facility] belongings or valuables.  Contacts, dentures or bridgework may not be worn into surgery.  Leave your suitcase in the car.  After surgery it may be brought to your room.  For patients admitted to the hospital, discharge time will be determined by your treatment team.  Patients discharged the day of surgery will not be allowed to drive home.   Name and phone number of your driver:   family Special instructions:  Follow the diet and prep instructions given to you by Dr Patty Sermonsehman's office.  Please read over the following fact sheets that you were given. Coughing and Deep Breathing, Surgical Site Infection Prevention, Anesthesia Post-op Instructions and Care and Recovery After Surgery      Colonoscopy A colonoscopy is an exam to look at the entire large intestine (colon). This exam can help find problems such as tumors, polyps, inflammation, and areas of bleeding. The exam takes about 1 hour.  [facility] PROVID[facility] KNOW ABOUT:   Any allergies you have.  All medicines you are taking, including vitamins, herbs, eye drops, creams, and over-the-counter medicines.  Previous problems you or members of your family have had with the use of anesthetics.  Any blood disorders  you have.  Previous surgeries you have had.  Medical conditions you have. RISKS AND COMPLICATIONS  Generally, this is a safe procedure. However, as with any procedure, complications can occur. Possible complications include:  Bleeding.  Tearing or rupture of the colon wall.  Reaction to medicines given during the exam.  Infection (rare). BEFORE THE PROCEDURE   Ask your health care provider about changing or stopping your regular medicines.  You may be prescribed an oral bowel prep. This involves drinking a large amount of medicated liquid, starting the day before your procedure. The liquid will cause you to have multiple loose stools until your stool is almost clear or light green. This cleans out your colon in preparation for the procedure.  Do not eat or drink anything else once you have started the bowel prep, unless your health care provider tells you it is safe to do so.  Arrange for someone to drive you home after the procedure. PROCEDURE   You will be given medicine to help you relax (sedative).  You will lie on your side with your knees bent.  A long, flexible tube with a light and camera on the end (colonoscope) will be inserted through the rectum and into the colon. The camera sends video back to a computer screen as it moves through the colon. The colonoscope also releases carbon dioxide gas to inflate the colon. This helps your health care provider see the  area better.  During the exam, your health care provider may take a small tissue sample (biopsy) to be examined under a microscope if any abnormalities are found.  The exam is finished when the entire colon has been viewed. AFTER THE PROCEDURE   Do not drive for 24 hours after the exam.  You may have a small amount of blood in your stool.  You may pass moderate amounts of gas and have mild abdominal cramping or bloating. This is caused by the gas used to inflate your colon during the exam.  Ask when your test  results will be ready and how you will get your results. Make sure you get your test results.   This information is not intended to replace advice given to you by your health care provider. Make sure you discuss any questions you have with your health care provider.   Document Released: 08/18/2000 Document Revised: 06/11/2013 Document Reviewed: 04/28/2013 Elsevier Interactive Patient Education 2016 [employer] Colonoscopy, Care After Refer to this sheet in the next few weeks. These instructions provide you with information on caring for yourself after your procedure. Your health care provider may also give you more specific instructions. Your treatment has been planned according to current medical practices, but problems sometimes occur. Call your health care provider if you have any problems or questions after your procedure. WHAT TO EXPECT AFTER THE PROCEDURE  After your procedure, it is typical to have the following:  A small amount of blood in your stool.  Moderate amounts of gas and mild abdominal cramping or bloating. HOME CARE INSTRUCTIONS  Do not drive, operate machinery, or sign important documents for 24 hours.  You may shower and resume your regular physical activities, but move at a slower pace for the first 24 hours.  Take frequent rest periods for the first 24 hours.  Walk around or put a warm pack on your abdomen to help reduce abdominal cramping and bloating.  Drink enough fluids to keep your urine clear or pale yellow.  You may resume your normal diet as instructed by your health care provider. Avoid heavy or fried foods that are hard to digest.  Avoid drinking alcohol for 24 hours or as instructed by your health care provider.  Only take over-the-counter or prescription medicines as directed by your health care provider.  If a tissue sample (biopsy) was taken during your procedure:  Do not take aspirin or blood thinners for 7 days, or as instructed by your  health care provider.  Do not drink alcohol for 7 days, or as instructed by your health care provider.  Eat soft foods for the first 24 hours. SEEK MEDICAL CARE IF: You have persistent spotting of blood in your stool 2-3 days after the procedure. SEEK IMMEDIATE MEDICAL CARE IF:  You have more than a small spotting of blood in your stool.  You pass large blood clots in your stool.  Your abdomen is swollen (distended).  You have nausea or vomiting.  You have a fever.  You have increasing abdominal pain that is not relieved with medicine.   This information is not intended to replace advice given to you by your health care provider. Make sure you discuss any questions you have with your health care provider.   Document Released: 04/04/2004 Document Revised: 06/11/2013 Document Reviewed: 04/28/2013 Elsevier Interactive Patient Education 2016 [employer] PATIENT INSTRUCTIONS POST-ANESTHESIA  IMMEDIATELY FOLLOWING SURGERY:  Do not drive or operate machinery for the first twenty four hours after  surgery.  Do not make any important decisions for twenty four hours after surgery or while taking narcotic pain medications or sedatives.  If you develop intractable nausea and vomiting or a severe headache please notify your doctor immediately.  FOLLOW-UP:  Please make an appointment with your surgeon as instructed. You do not need to follow up with anesthesia unless specifically instructed to do so.  WOUND CARE INSTRUCTIONS (if applicable):  Keep a dry clean dressing on the anesthesia/puncture wound site if there is drainage.  Once the wound has quit draining you may leave it open to air.  Generally you should leave the bandage intact for twenty four hours unless there is drainage.  If the epidural site drains for more than 36-48 hours please call the anesthesia department.  QUESTIONS?:  Please feel free to call your physician or the hospital operator if you have any questions, and they will  be happy to assist you.

## 2015-11-17 ENCOUNTER — Encounter (HOSPITAL_COMMUNITY): Payer: Self-pay

## 2015-11-17 ENCOUNTER — Encounter (HOSPITAL_COMMUNITY)
Admission: RE | Admit: 2015-11-17 | Discharge: 2015-11-17 | Disposition: A | Discharge status: Home / Self Care | Payer: Medicaid Other | Source: Ambulatory Visit | Attending: Internal Medicine | Admitting: Internal Medicine

## 2015-11-19 ENCOUNTER — Encounter (HOSPITAL_COMMUNITY): Payer: Self-pay | Admitting: Anesthesiology

## 2015-11-19 ENCOUNTER — Encounter (INDEPENDENT_AMBULATORY_CARE_PROVIDER_SITE_OTHER): Payer: Self-pay | Admitting: *Deleted

## 2015-11-19 ENCOUNTER — Other Ambulatory Visit (INDEPENDENT_AMBULATORY_CARE_PROVIDER_SITE_OTHER): Payer: Self-pay | Admitting: Internal Medicine

## 2015-11-19 ENCOUNTER — Telehealth (INDEPENDENT_AMBULATORY_CARE_PROVIDER_SITE_OTHER): Payer: Self-pay | Admitting: Internal Medicine

## 2015-11-19 ENCOUNTER — Encounter (HOSPITAL_COMMUNITY): Admission: RE | Payer: Self-pay | Source: Ambulatory Visit

## 2015-11-19 ENCOUNTER — Ambulatory Visit (HOSPITAL_COMMUNITY): Admission: RE | Admit: 2015-11-19 | Payer: Medicaid Other | Source: Ambulatory Visit | Admitting: Internal Medicine

## 2015-11-19 ENCOUNTER — Other Ambulatory Visit (INDEPENDENT_AMBULATORY_CARE_PROVIDER_SITE_OTHER): Payer: Self-pay | Admitting: *Deleted

## 2015-11-19 DIAGNOSIS — Z1211 Encounter for screening for malignant neoplasm of colon: Secondary | ICD-10-CM

## 2015-11-19 SURGERY — COLONOSCOPY WITH PROPOFOL
Anesthesia: Monitor Anesthesia Care

## 2015-11-19 MED ORDER — LIDOCAINE HCL (PF) 1 % IJ SOLN
INTRAMUSCULAR | Status: AC
  Filled 2015-11-19: qty 5

## 2015-11-19 MED ORDER — PROPOFOL 10 MG/ML IV BOLUS
INTRAVENOUS | Status: AC
  Filled 2015-11-19: qty 40

## 2015-11-19 MED ORDER — PEG 3350-KCL-NA BICARB-NACL 420 G PO SOLR: 4000.0000 mL | Freq: Once | ORAL | Status: DC

## 2015-11-19 NOTE — Telephone Encounter (Signed)
Patient needs trilyte 

## 2015-11-19 NOTE — Telephone Encounter (Signed)
Pt called saying the prep made him vomit and gave him diarrhea all night long. He went to sleep around 4am and when he woke up it was too late to come.

## 2015-12-21 ENCOUNTER — Other Ambulatory Visit (HOSPITAL_COMMUNITY): Payer: Medicaid Other

## 2015-12-21 NOTE — Patient Instructions (Signed)
Steven Larson  12/21/2015     @   Your procedure is scheduled on  12/24/2015   Report to [facility] at  615  A.M.  Call this number if you have problems the morning of surgery:  [phone]   Remember:  Do not eat food or drink liquids after midnight.  Take these medicines the morning of surgery with A SIP OF WATER  Xanax, zyrtec, nexium, keppra, levothyroxine, methadone, lyrica. Take your inhaler before you come and bring it with you.   Do not wear jewelry, make-up or nail polish.  Do not wear lotions, powders, or perfumes.  You may wear deodorant.  Do not shave 48 hours prior to surgery.  Men may shave face and neck.  Do not bring valuables to the hospital.  [facility] is not responsible for any belongings or valuables.  Contacts, dentures or bridgework may not be worn into surgery.  Leave your suitcase in the car.  After surgery it may be brought to your room.  For patients admitted to the hospital, discharge time will be determined by your treatment team.  Patients discharged the day of surgery will not be allowed to drive home.   Name and phone number of your driver:   family Special instructions:  Follow the diet and prep instructions given to you by Dr Patty Sermons office.  Please read over the following fact sheets that you were given. Coughing and Deep Breathing, MRSA Information, Surgical Site Infection Prevention and Anesthesia Post-op Instructions      Colonoscopy A colonoscopy is an exam to look at the entire large intestine (colon). This exam can help find problems such as tumors, polyps, inflammation, and areas of bleeding. The exam takes about 1 hour.  LET [facility] CARE PROVIDER KNOW ABOUT:   Any allergies you have.  All medicines you are taking, including vitamins, herbs, eye drops, creams, and over-the-counter medicines.  Previous problems you or members of your family have had with the use of anesthetics.  Any blood  disorders you have.  Previous surgeries you have had.  Medical conditions you have. RISKS AND COMPLICATIONS  Generally, this is a safe procedure. However, as with any procedure, complications can occur. Possible complications include:  Bleeding.  Tearing or rupture of the colon wall.  Reaction to medicines given during the exam.  Infection (rare). BEFORE THE PROCEDURE   Ask your health care provider about changing or stopping your regular medicines.  You may be prescribed an oral bowel prep. This involves drinking a large amount of medicated liquid, starting the day before your procedure. The liquid will cause you to have multiple loose stools until your stool is almost clear or light green. This cleans out your colon in preparation for the procedure.  Do not eat or drink anything else once you have started the bowel prep, unless your health care provider tells you it is safe to do so.  Arrange for someone to drive you home after the procedure. PROCEDURE   You will be given medicine to help you relax (sedative).  You will lie on your side with your knees bent.  A long, flexible tube with a light and camera on the end (colonoscope) will be inserted through the rectum and into the colon. The camera sends video back to a computer screen as it moves through the colon. The colonoscope also releases carbon dioxide gas to inflate the colon. This helps your health care provider  see the area better.  During the exam, your health care provider may take a small tissue sample (biopsy) to be examined under a microscope if any abnormalities are found.  The exam is finished when the entire colon has been viewed. AFTER THE PROCEDURE   Do not drive for 24 hours after the exam.  You may have a small amount of blood in your stool.  You may pass moderate amounts of gas and have mild abdominal cramping or bloating. This is caused by the gas used to inflate your colon during the exam.  Ask when  your test results will be ready and how you will get your results. Make sure you get your test results.   This information is not intended to replace advice given to you by your health care provider. Make sure you discuss any questions you have with your health care provider.   Document Released: 08/18/2000 Document Revised: 06/11/2013 Document Reviewed: 04/28/2013 Elsevier Interactive Patient Education 2016 [employer] Colonoscopy, Care After Refer to this sheet in the next few weeks. These instructions provide you with information on caring for yourself after your procedure. Your health care provider may also give you more specific instructions. Your treatment has been planned according to current medical practices, but problems sometimes occur. Call your health care provider if you have any problems or questions after your procedure. WHAT TO EXPECT AFTER THE PROCEDURE  After your procedure, it is typical to have the following:  A small amount of blood in your stool.  Moderate amounts of gas and mild abdominal cramping or bloating. HOME CARE INSTRUCTIONS  Do not drive, operate machinery, or sign important documents for 24 hours.  You may shower and resume your regular physical activities, but move at a slower pace for the first 24 hours.  Take frequent rest periods for the first 24 hours.  Walk around or put a warm pack on your abdomen to help reduce abdominal cramping and bloating.  Drink enough fluids to keep your urine clear or pale yellow.  You may resume your normal diet as instructed by your health care provider. Avoid heavy or fried foods that are hard to digest.  Avoid drinking alcohol for 24 hours or as instructed by your health care provider.  Only take over-the-counter or prescription medicines as directed by your health care provider.  If a tissue sample (biopsy) was taken during your procedure:  Do not take aspirin or blood thinners for 7 days, or as instructed by  your health care provider.  Do not drink alcohol for 7 days, or as instructed by your health care provider.  Eat soft foods for the first 24 hours. SEEK MEDICAL CARE IF: You have persistent spotting of blood in your stool 2-3 days after the procedure. SEEK IMMEDIATE MEDICAL CARE IF:  You have more than a small spotting of blood in your stool.  You pass large blood clots in your stool.  Your abdomen is swollen (distended).  You have nausea or vomiting.  You have a fever.  You have increasing abdominal pain that is not relieved with medicine.   This information is not intended to replace advice given to you by your health care provider. Make sure you discuss any questions you have with your health care provider.   Document Released: 04/04/2004 Document Revised: 06/11/2013 Document Reviewed: 04/28/2013 Elsevier Interactive Patient Education 2016 [employer] PATIENT INSTRUCTIONS POST-ANESTHESIA  IMMEDIATELY FOLLOWING SURGERY:  Do not drive or operate machinery for the first twenty four  hours after surgery.  Do not make any important decisions for twenty four hours after surgery or while taking narcotic pain medications or sedatives.  If you develop intractable nausea and vomiting or a severe headache please notify your doctor immediately.  FOLLOW-UP:  Please make an appointment with your surgeon as instructed. You do not need to follow up with anesthesia unless specifically instructed to do so.  WOUND CARE INSTRUCTIONS (if applicable):  Keep a dry clean dressing on the anesthesia/puncture wound site if there is drainage.  Once the wound has quit draining you may leave it open to air.  Generally you should leave the bandage intact for twenty four hours unless there is drainage.  If the epidural site drains for more than 36-48 hours please call the anesthesia department.  QUESTIONS?:  Please feel free to call your physician or the hospital operator if you have any questions, and they  will be happy to assist you.

## 2015-12-22 ENCOUNTER — Encounter (HOSPITAL_COMMUNITY)
Admission: RE | Admit: 2015-12-22 | Discharge: 2015-12-22 | Disposition: A | Discharge status: Home / Self Care | Payer: Medicaid Other | Source: Ambulatory Visit | Attending: Internal Medicine | Admitting: Internal Medicine

## 2015-12-24 ENCOUNTER — Encounter (HOSPITAL_COMMUNITY): Admission: RE | Payer: Self-pay | Source: Ambulatory Visit

## 2015-12-24 ENCOUNTER — Ambulatory Visit (HOSPITAL_COMMUNITY): Admission: RE | Admit: 2015-12-24 | Payer: Medicaid Other | Source: Ambulatory Visit | Admitting: Internal Medicine

## 2015-12-24 SURGERY — COLONOSCOPY WITH PROPOFOL
Anesthesia: Monitor Anesthesia Care

## 2016-02-28 IMAGING — DX DG CHEST 2V
2 series · 2 of 2 positions shown · non-contrast
Comparison: October 08, 2014

CLINICAL DATA: Hypoxia and low-grade fever for 2 weeks

EXAM:
CHEST  2 VIEW

[chest pa]
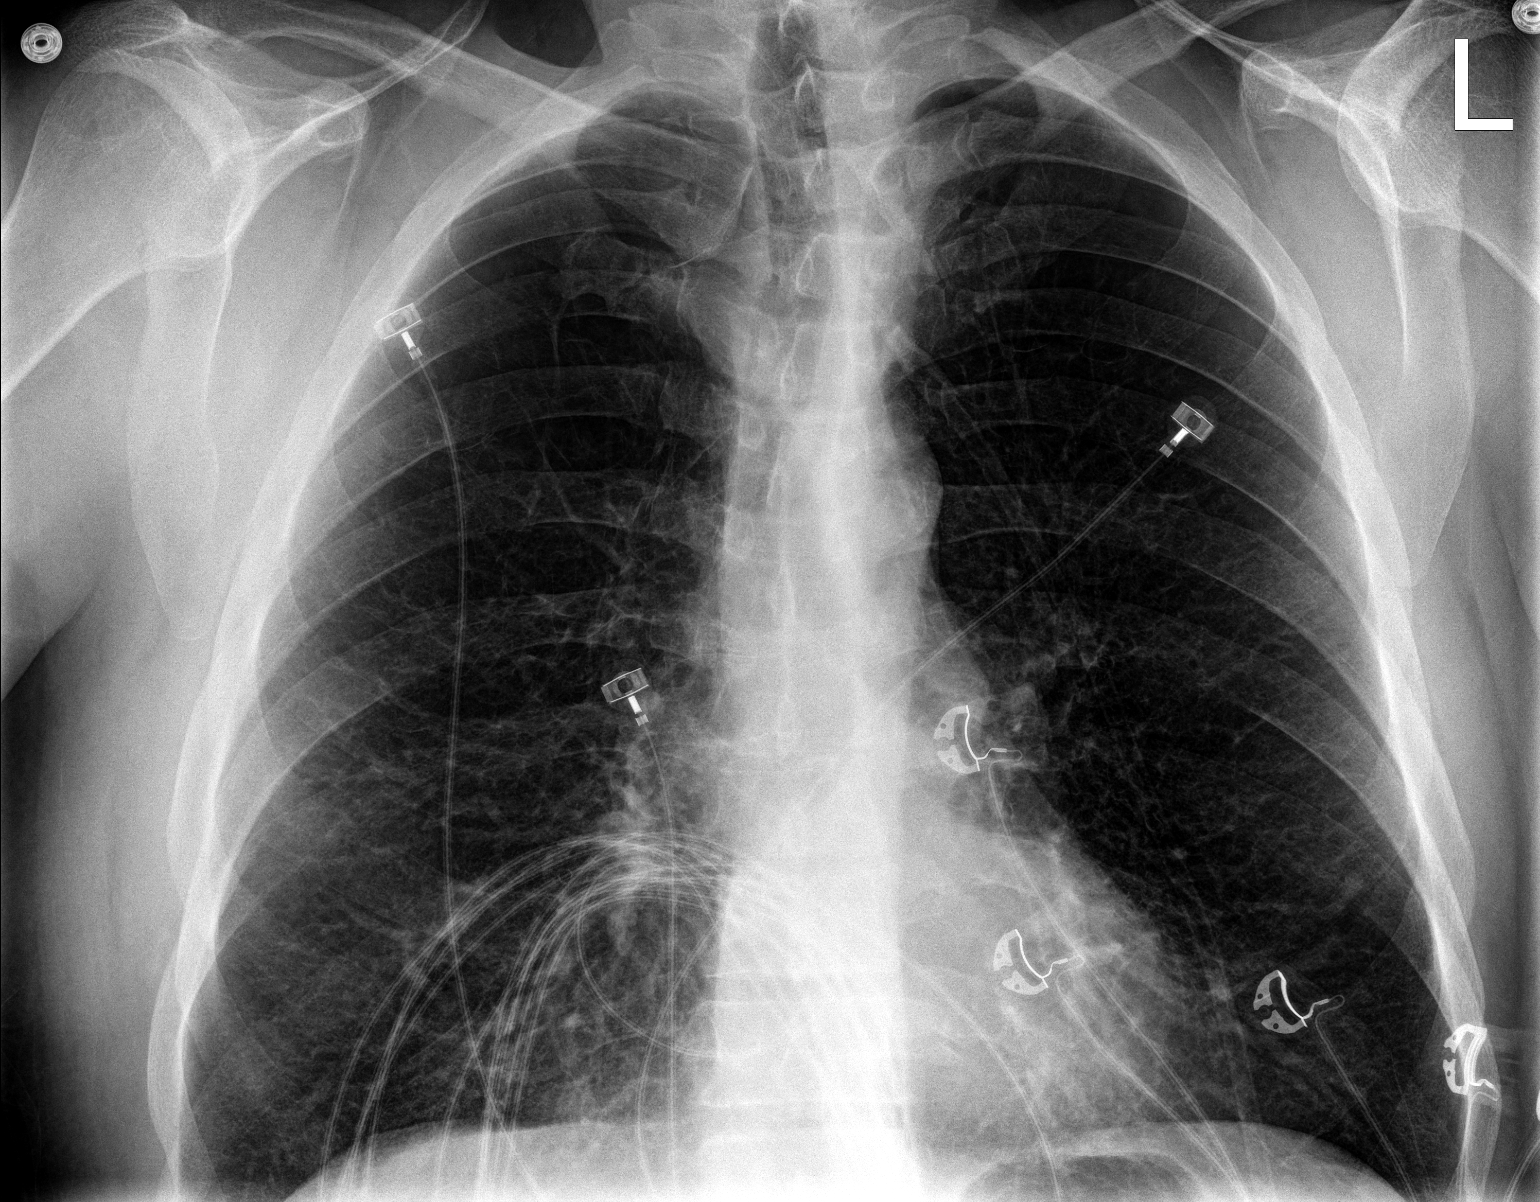

[chest lat]
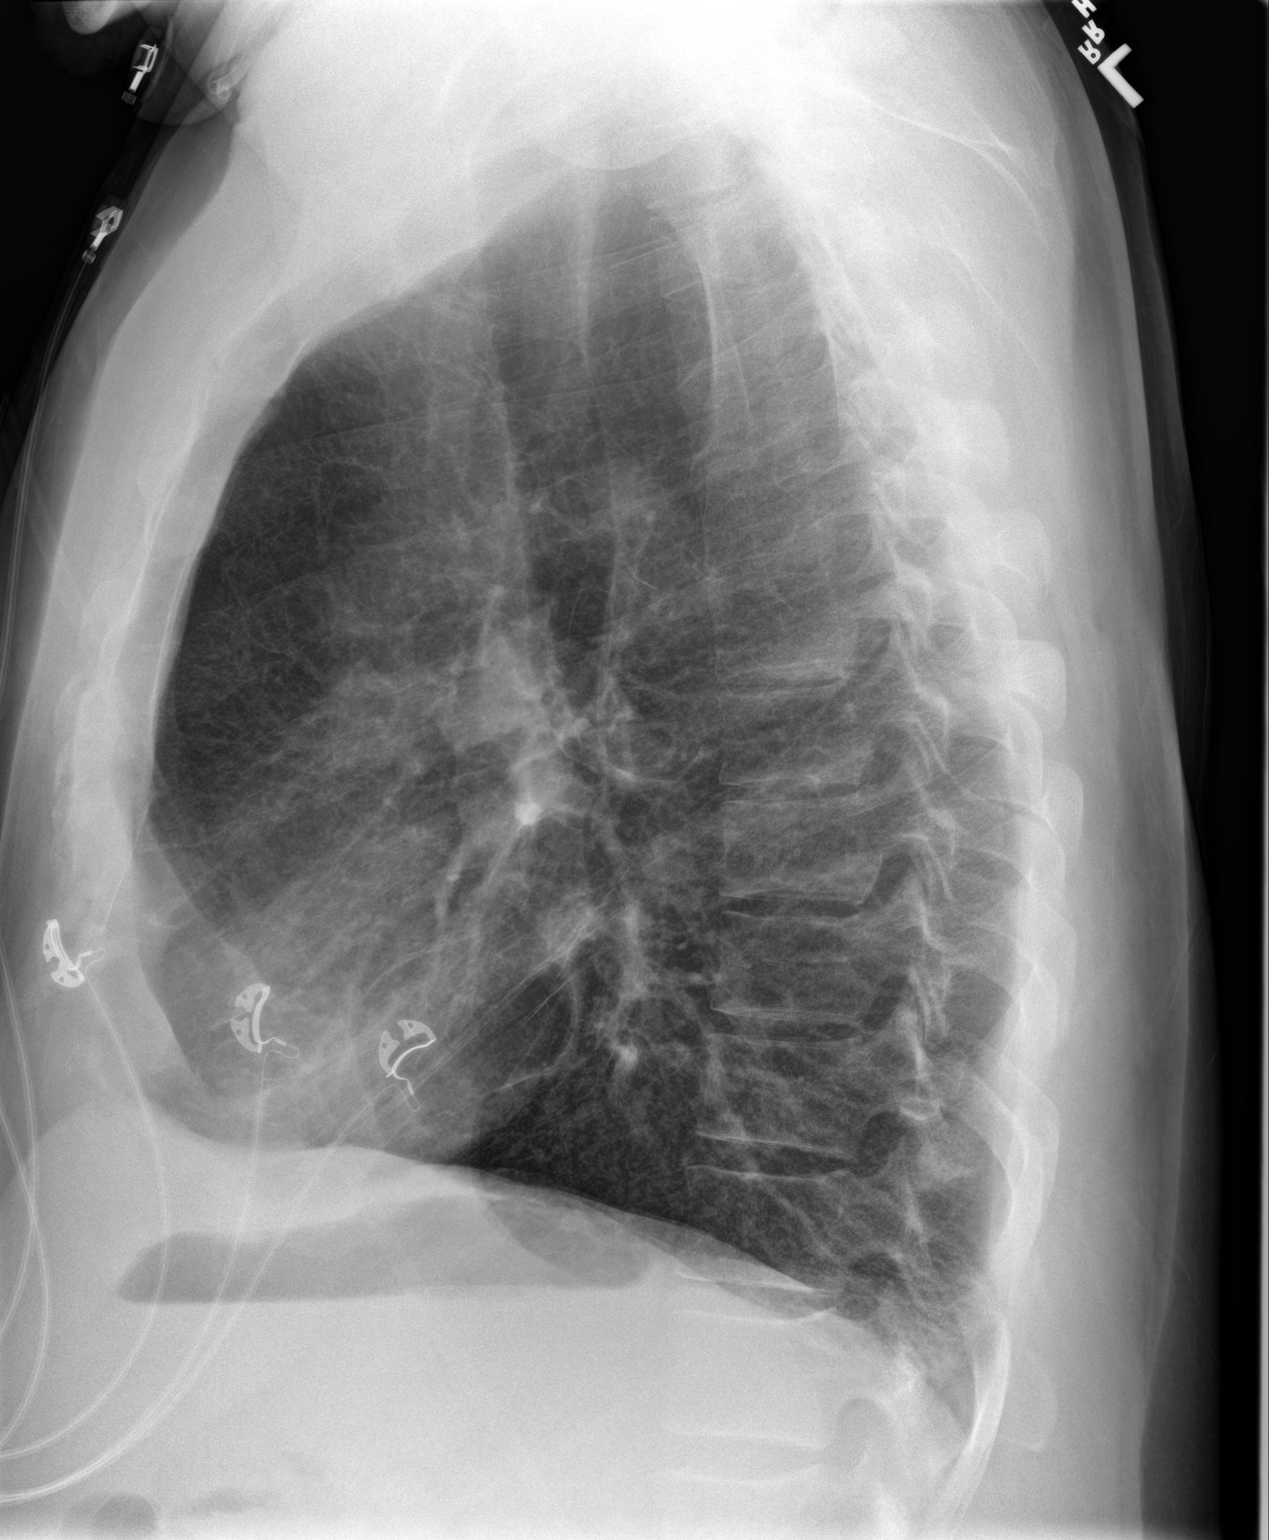

[2 of 2 positions shown; findings below may reference images not displayed]

FINDINGS: There is underlying emphysematous change. There is no edema or
consolidation. The heart size is normal. The pulmonary vascularity
reflects the underlying emphysematous change. No adenopathy. No bone
lesions.
IMPRESSION: Underlying emphysema.  No edema or consolidation.

## 2016-04-03 ENCOUNTER — Ambulatory Visit: Payer: Medicaid Other | Admitting: "Endocrinology

## 2016-04-06 ENCOUNTER — Other Ambulatory Visit: Payer: Self-pay | Admitting: "Endocrinology

## 2016-05-09 ENCOUNTER — Other Ambulatory Visit: Payer: Self-pay | Admitting: "Endocrinology

## 2016-05-15 IMAGING — CR DG CHEST 2V
3 series · 3 of 3 positions shown · non-contrast
Comparison: 10/30/2014 and 10/08/2014

CLINICAL DATA: Fever, cough and congestion with weakness a few days
getting worse.

EXAM:
CHEST  2 VIEW

[view not recorded (1 of 3)]
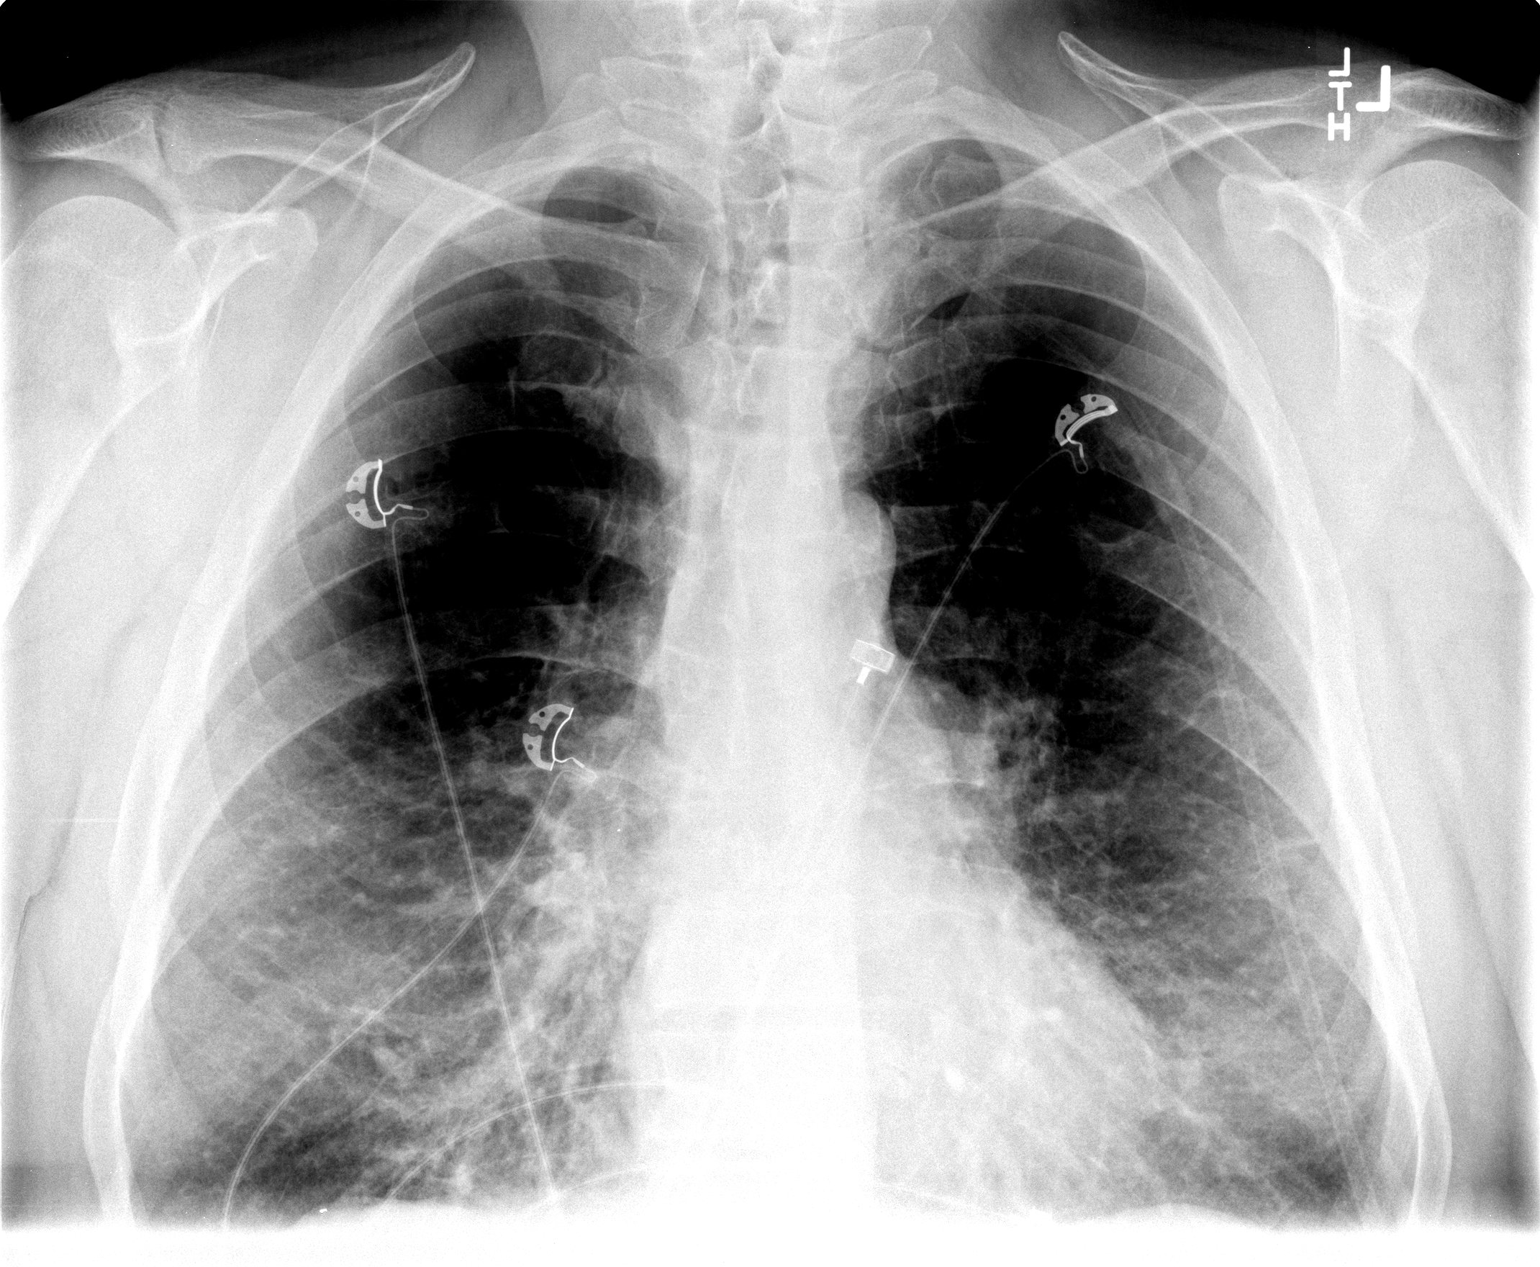

[view not recorded (2 of 3)]
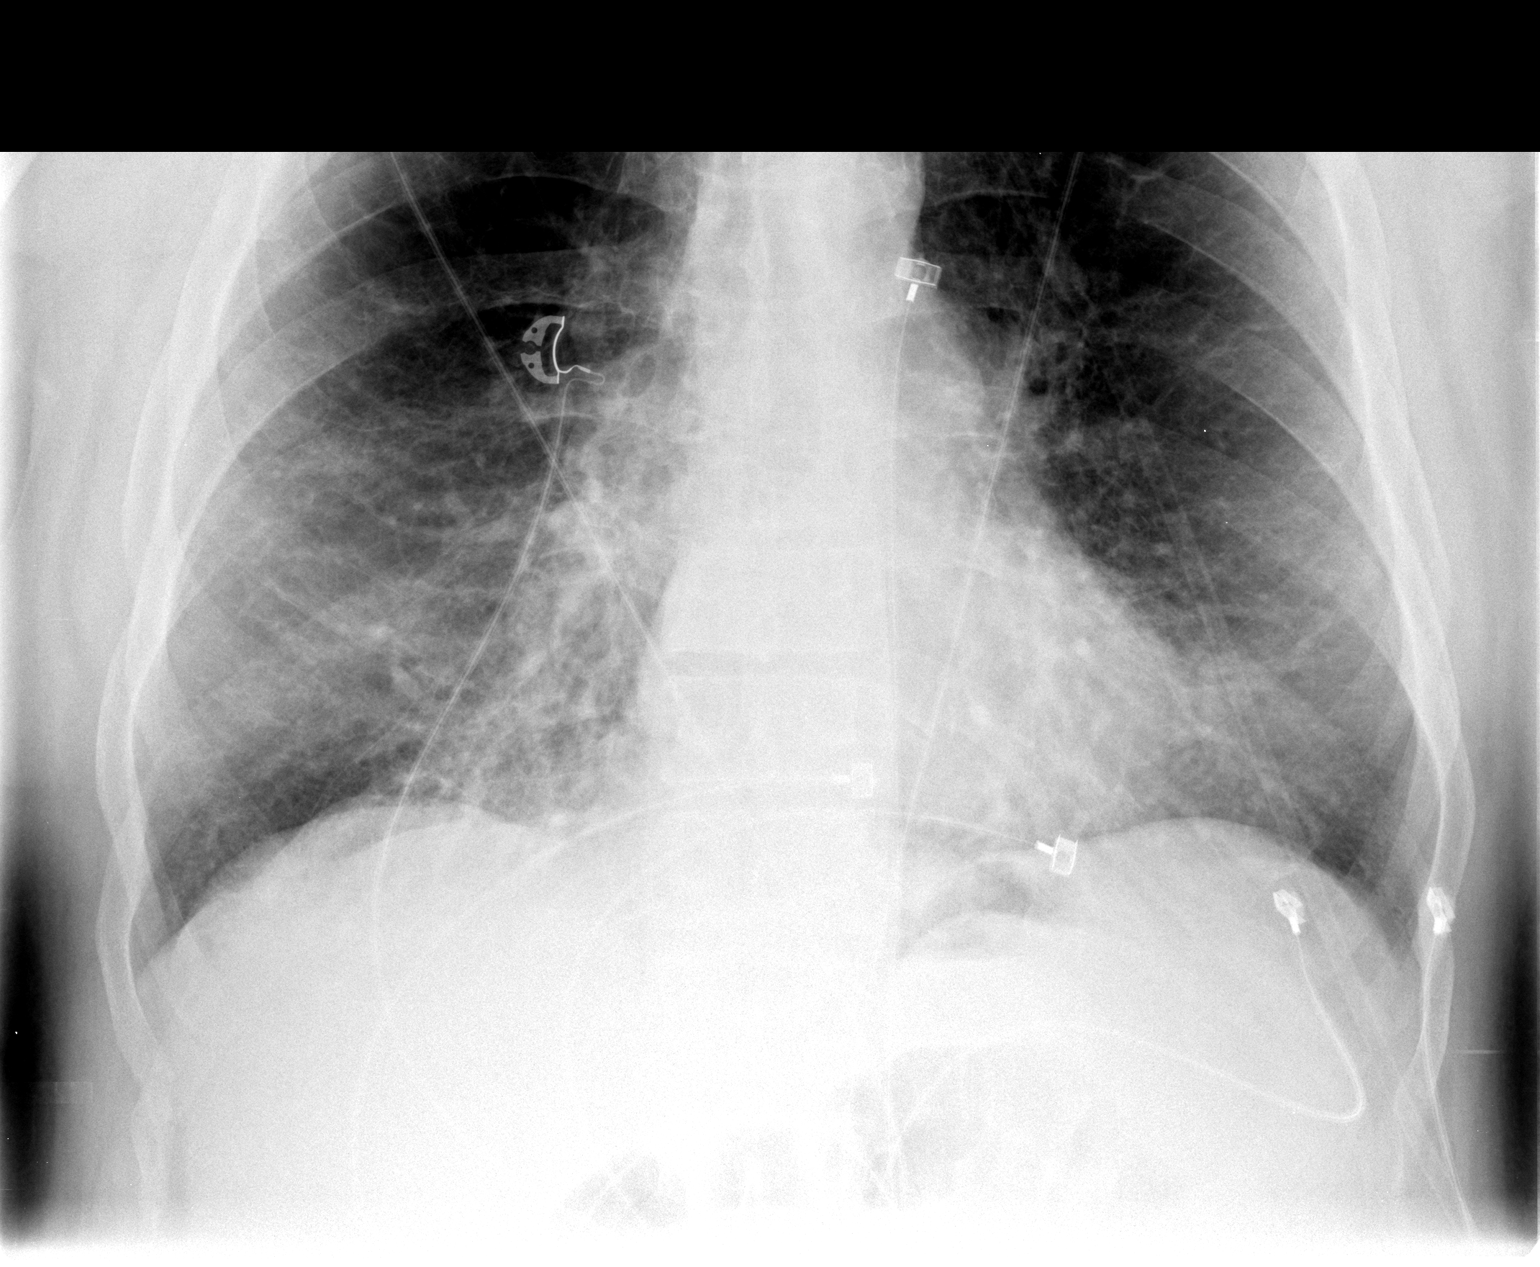

[view not recorded (3 of 3)]
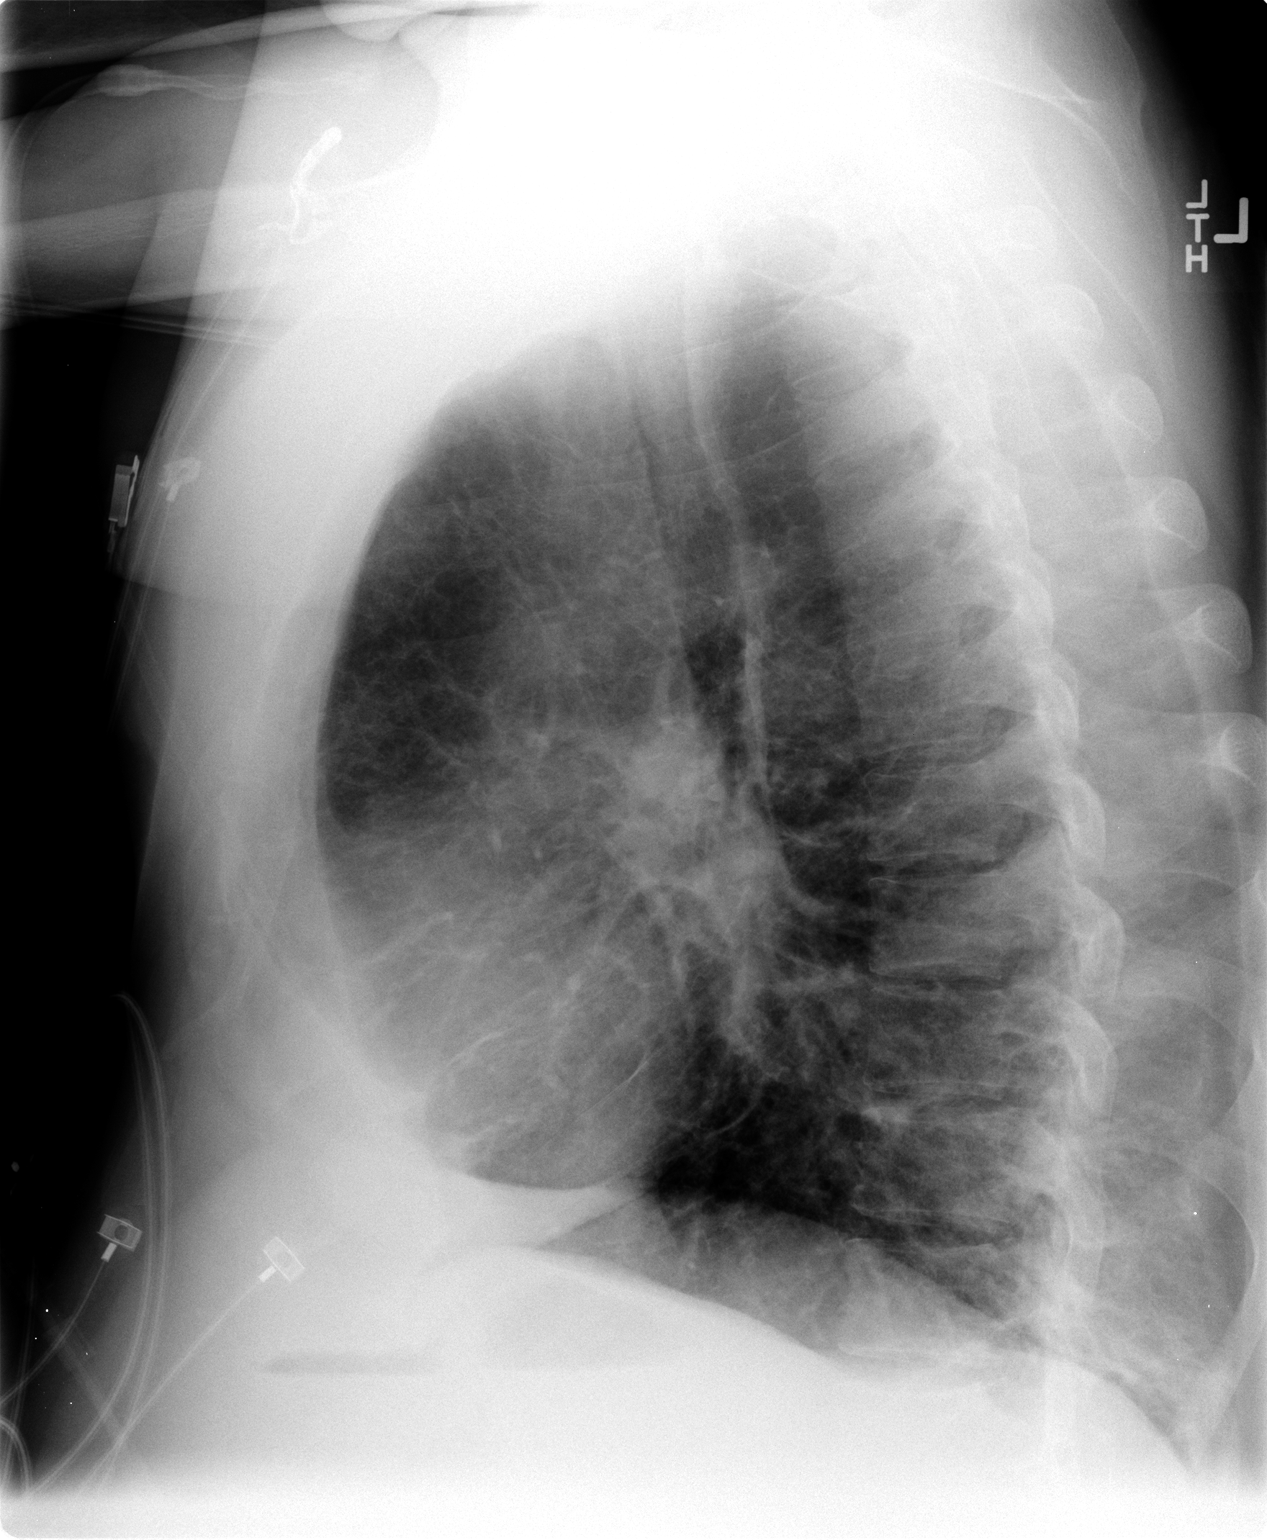

[3 of 3 positions shown; findings below may reference images not displayed]

FINDINGS: Lungs are adequately inflated demonstrate mild emphysematous disease
over the upper lungs. Minimal prominence of the infrahilar markings
unchanged. No definite effusion. Cardiomediastinal silhouette and
remainder the exam is unchanged.
IMPRESSION: Possible minimal vascular congestion.

Mild emphysematous disease.

## 2016-06-01 ENCOUNTER — Other Ambulatory Visit: Payer: Self-pay | Admitting: "Endocrinology

## 2016-06-01 DIAGNOSIS — E039 Hypothyroidism, unspecified: Secondary | ICD-10-CM

## 2016-06-01 DIAGNOSIS — R7303 Prediabetes: Secondary | ICD-10-CM

## 2016-06-01 LAB — BASIC METABOLIC PANEL
BUN: 8 mg/dL (ref 7–25)
CALCIUM: 9 mg/dL (ref 8.6–10.3)
CO2: 24 mmol/L (ref 20–31)
Chloride: 102 mmol/L (ref 98–110)
Creat: 1.03 mg/dL (ref 0.70–1.33)
GLUCOSE: 83 mg/dL (ref 65–99)
POTASSIUM: 3.4 mmol/L — AB (ref 3.5–5.3)
SODIUM: 137 mmol/L (ref 135–146)

## 2016-06-01 LAB — HEMOGLOBIN A1C
Hgb A1c MFr Bld: 5.1 % (ref <5.7)
Mean Plasma Glucose: 100 mg/dL

## 2016-06-01 LAB — TSH: TSH: 0.5 m[IU]/L (ref 0.40–4.50)

## 2016-06-01 LAB — T4, FREE: FREE T4: 1.3 ng/dL (ref 0.8–1.8)

## 2016-06-07 ENCOUNTER — Other Ambulatory Visit: Payer: Self-pay | Admitting: "Endocrinology

## 2016-06-15 ENCOUNTER — Ambulatory Visit: Payer: Medicaid Other | Admitting: "Endocrinology

## 2016-07-04 ENCOUNTER — Encounter: Payer: Self-pay | Admitting: "Endocrinology

## 2016-07-04 ENCOUNTER — Ambulatory Visit (INDEPENDENT_AMBULATORY_CARE_PROVIDER_SITE_OTHER): Payer: Medicaid Other | Admitting: "Endocrinology

## 2016-07-04 VITALS — BP 122/76 | HR 60 | Ht 71.0 in | Wt 208.0 lb

## 2016-07-04 DIAGNOSIS — R7303 Prediabetes: Secondary | ICD-10-CM

## 2016-07-04 DIAGNOSIS — E890 Postprocedural hypothyroidism: Secondary | ICD-10-CM | POA: Diagnosis not present

## 2016-07-04 NOTE — Progress Notes (Signed)
Subjective:    Patient ID: Steven Larson, male    DOB: 12/14/1960,    Past Medical History:  Diagnosis Date  . Back pain   . CAD (coronary artery disease)   . Cholelithiasis   . COPD (chronic obstructive pulmonary disease) (HCC)   . Diabetes mellitus without complication [facility](HCC)    Patient states "borderline"  . Emphysema   . Hypothyroidism due to medication   . Prediabetes   . Thyroid disease    Past Surgical History:  Procedure Laterality Date  . BACK SURGERY    . CHOLECYSTECTOMY     Social History   Social History  . Marital status: Divorced    Spouse name: N/A  . Number of children: N/A  . Years of education: N/A   Social History Main Topics  . Smoking status: Current Every Day Smoker    Types: Cigarettes  . Smokeless tobacco: Never Used  . Alcohol use No  . Drug use: No  . Sexual activity: Not Asked   Other Topics Concern  . None   Social History Narrative  . None   Outpatient Encounter Prescriptions as of 07/04/2016  Medication Sig  . albuterol (PROVENTIL HFA;VENTOLIN HFA) 108 (90 BASE) MCG/ACT inhaler Inhale 2 puffs into the lungs every 4 (four) hours as needed for wheezing or shortness of breath.  . ALPRAZolam (XANAX) 1 MG tablet Take 1 mg by mouth 3 (three) times daily.  . cetirizine (ZYRTEC) 10 MG tablet Take 10 mg by mouth every morning.  Marland Kitchen. esomeprazole (NEXIUM) 40 MG capsule Take 40 mg by mouth daily at 12 noon.  Marland Kitchen. ibuprofen (ADVIL,MOTRIN) 200 MG tablet Take 400 mg by mouth every 6 (six) hours as needed for moderate pain.  . methadone (DOLOPHINE) 10 MG tablet Take 10 mg by mouth every 8 (eight) hours.  . polyethylene glycol-electrolytes (NULYTELY/GOLYTELY) 420 g solution Take 4,000 mLs by mouth once.  . pregabalin (LYRICA) 75 MG capsule Take 75-150 mg by mouth at bedtime.   Marland Kitchen. SYNTHROID 100 MCG tablet TAKE (1) TABLET BY MOUTH EACH MORNING.  . [DISCONTINUED] levETIRAcetam (KEPPRA) 500 MG tablet Take 1 tablet by mouth at bedtime.  . [DISCONTINUED]  metFORMIN (GLUCOPHAGE) 500 MG tablet Take 1 tablet (500 mg total) by mouth daily.   No facility-administered encounter medications on file as of 07/04/2016.    ALLERGIES: No Known Allergies VACCINATION STATUS:  There is no immunization history on file for this patient.  HPI  55 yr old pt , s/p RAI for GD. He is here for f/u. he is on LT4 100 mcg. He  lost some weight , his A1c improved to 5.1% no palpitations, + cold intolerance. he c/o cough, continued to smoke heavily.  Review of Systems   Constitutional: - weight loss,  + fatigue, no subjective hyperthermia/hypothermia Eyes: no blurry vision, no xerophthalmia ENT: no sore throat, no nodules palpated in throat, no dysphagia/odynophagia, no hoarseness Cardiovascular: no CP/SOB/palpitations/leg swelling Respiratory: +cough/ +SOB Gastrointestinal: no N/V/D/C Musculoskeletal: no muscle/joint aches Skin: no rashes Neurological: no tremors/numbness/tingling/dizziness Psychiatric: no depression/anxiety  Objective:    BP 122/76   Pulse 60   Ht 5\' 11"  (1.803 m)   Wt 208 lb (94.3 kg)   BMI 29.01 kg/m   Wt Readings from Last 3 Encounters:  07/04/16 208 lb (94.3 kg)  11/02/15 207 lb (93.9 kg)  10/04/15 212 lb (96.2 kg)   SPO2= 91%  Physical Exam Constitutional: over weight, in NAD Eyes: PERRLA, EOMI, no exophthalmos ENT: moist mucous  membranes, no thyromegaly, no cervical lymphadenopathy Cardiovascular: RRR, No MRG Respiratory: scattered wheezes, His hypoxic with SPO2 of 91%. Gastrointestinal: abdomen soft, NT, ND, BS+ Musculoskeletal: no deformities, strength intact in all 4 Skin: moist, warm, no rashes Neurological: no tremor with outstretched hands, DTR normal in all 4  Results for orders placed or performed in visit on 06/01/16  TSH  Result Value Ref Range   TSH 0.50 0.40 - 4.50 mIU/L  T4, Free  Result Value Ref Range   Free T4 1.3 0.8 - 1.8 ng/dL  Hemoglobin U9WA1c  Result Value Ref Range   Hgb A1c MFr Bld 5.1  <5.7 %   Mean Plasma Glucose 100 mg/dL  Basic Metabolic Panel  Result Value Ref Range   Sodium 137 135 - 146 mmol/L   Potassium 3.4 (L) 3.5 - 5.3 mmol/L   Chloride 102 98 - 110 mmol/L   CO2 24 20 - 31 mmol/L   Glucose, Bld 83 65 - 99 mg/dL   BUN 8 7 - 25 mg/dL   Creat 1.191.03 1.470.70 - 8.291.33 mg/dL   Calcium 9.0 8.6 - 56.210.3 mg/dL   Complete Blood Count (Most recent): Lab Results  Component Value Date   WBC 6.4 11/02/2015   HGB 13.5 11/02/2015   HCT 40.8 11/02/2015   MCV 87.2 11/02/2015   PLT 123 (L) 11/02/2015      Assessment & Plan:   1. Hypothyroidism due to medication -his thyroid function tests are consistent with appropriate replacement. I will  continue levothyroxine   100 g by mouth every morning.  - We discussed about correct intake of levothyroxine, at fasting, with water, separated by at least 30 minutes from breakfast, and separated by more than 4 hours from calcium, iron, multivitamins, acid reflux medications (PPIs). -Patient is made aware of the fact that thyroid hormone replacement is needed for life, dose to be adjusted by periodic monitoring of thyroid function tests. His fatigue is slightly due to his COPD with SPO2 of 91%. I have extensively counseled him to consider smoking cessation.  2. Pre-diabetes He is at risk of type 2 diabetes.  Since he lost some weight and A1c 5.1%, I have advised him to discontinue metformin for now. We'll repeat A1c in 6 month.   He will continue to follow up with his PMD for his other medical needs.   Follow up plan: Return in about 6 months (around 01/01/2017) for follow up with pre-visit labs.  Marquis LunchGebre Nida, MD Phone: [phone]  Fax: [phone]   07/04/2016, 9:46 AM

## 2016-08-31 ENCOUNTER — Other Ambulatory Visit: Payer: Self-pay | Admitting: "Endocrinology

## 2016-11-09 ENCOUNTER — Other Ambulatory Visit: Payer: Self-pay | Admitting: "Endocrinology

## 2016-11-09 LAB — T4, FREE: Free T4: 1.3 ng/dL (ref 0.8–1.8)

## 2016-11-09 LAB — HEMOGLOBIN A1C
Hgb A1c MFr Bld: 5.1 % (ref <5.7)
Mean Plasma Glucose: 100 mg/dL

## 2016-11-09 LAB — TSH: TSH: 0.63 mIU/L (ref 0.40–4.50)

## 2016-12-04 ENCOUNTER — Other Ambulatory Visit: Payer: Self-pay

## 2016-12-04 MED ORDER — LEVOTHYROXINE SODIUM 100 MCG PO TABS: 100.0000 ug | ORAL_TABLET | Freq: Every day | ORAL | 3 refills | Status: DC

## 2017-01-04 ENCOUNTER — Encounter: Payer: Self-pay | Admitting: "Endocrinology

## 2017-01-04 ENCOUNTER — Ambulatory Visit: Payer: Medicaid Other | Admitting: "Endocrinology

## 2017-05-25 ENCOUNTER — Encounter (INDEPENDENT_AMBULATORY_CARE_PROVIDER_SITE_OTHER): Payer: Self-pay | Admitting: *Deleted

## 2017-05-28 ENCOUNTER — Telehealth: Payer: Self-pay

## 2017-05-28 NOTE — Telephone Encounter (Signed)
I called pt to let him know Dr Delton See does not manage long term pain medication.  I wanted to make sure he knows that and to see if he wants to keep his appt.

## 2017-05-29 ENCOUNTER — Telehealth: Payer: Self-pay

## 2017-05-29 NOTE — Telephone Encounter (Signed)
lmom to call office - I need for him to know we do not follow chronic pain and chronic pain medication

## 2017-05-30 ENCOUNTER — Ambulatory Visit: Payer: Medicaid Other | Admitting: Family Medicine

## 2017-07-09 ENCOUNTER — Ambulatory Visit: Payer: Self-pay | Admitting: Family Medicine

## 2017-11-29 ENCOUNTER — Other Ambulatory Visit: Payer: Self-pay | Admitting: "Endocrinology

## 2017-11-29 DIAGNOSIS — R739 Hyperglycemia, unspecified: Secondary | ICD-10-CM

## 2017-11-29 DIAGNOSIS — E0590 Thyrotoxicosis, unspecified without thyrotoxic crisis or storm: Secondary | ICD-10-CM

## 2017-11-29 LAB — HEMOGLOBIN A1C
EAG (MMOL/L): 5.5 (calc)
Hgb A1c MFr Bld: 5.1 % of total Hgb (ref <5.7)
MEAN PLASMA GLUCOSE: 100 (calc)

## 2017-11-29 LAB — TSH: TSH: 0.32 mIU/L — ABNORMAL LOW (ref 0.40–4.50)

## 2017-11-29 LAB — T4, FREE: FREE T4: 1.4 ng/dL (ref 0.8–1.8)

## 2017-12-19 ENCOUNTER — Encounter: Payer: Self-pay | Admitting: "Endocrinology

## 2017-12-19 ENCOUNTER — Ambulatory Visit (INDEPENDENT_AMBULATORY_CARE_PROVIDER_SITE_OTHER): Payer: Medicare Other | Admitting: "Endocrinology

## 2017-12-19 VITALS — BP 104/66 | HR 62 | Ht 71.0 in | Wt 180.0 lb

## 2017-12-19 DIAGNOSIS — E890 Postprocedural hypothyroidism: Secondary | ICD-10-CM | POA: Diagnosis not present

## 2017-12-19 MED ORDER — LEVOTHYROXINE SODIUM 88 MCG PO TABS: 88.0000 ug | ORAL_TABLET | Freq: Every day | ORAL | 3 refills | Status: DC

## 2017-12-19 NOTE — Progress Notes (Signed)
Subjective:    Patient ID: Steven Larson, male    DOB: 1961-07-17,    Past Medical History:  Diagnosis Date  . Back pain   . CAD (coronary artery disease)   . Cholelithiasis   . COPD (chronic obstructive pulmonary disease) (HCC)   . Diabetes mellitus without complication [facility])    Patient states "borderline"  . Emphysema   . Hypothyroidism due to medication   . Prediabetes   . Thyroid disease    Past Surgical History:  Procedure Laterality Date  . BACK SURGERY    . CHOLECYSTECTOMY     Social History   Socioeconomic History  . Marital status: Divorced    Spouse name: Not on file  . Number of children: Not on file  . Years of education: Not on file  . Highest education level: Not on file  Occupational History  . Not on file  Social Needs  . Financial resource strain: Not on file  . Food insecurity:    Worry: Not on file    Inability: Not on file  . Transportation needs:    Medical: Not on file    Non-medical: Not on file  Tobacco Use  . Smoking status: Current Every Day Smoker    Types: Cigarettes  . Smokeless tobacco: Never Used  Substance and Sexual Activity  . Alcohol use: No  . Drug use: No  . Sexual activity: Not on file  Lifestyle  . Physical activity:    Days per week: Not on file    Minutes per session: Not on file  . Stress: Not on file  Relationships  . Social connections:    Talks on phone: Not on file    Gets together: Not on file    Attends religious service: Not on file    Active member of club or organization: Not on file    Attends meetings of clubs or organizations: Not on file    Relationship status: Not on file  Other Topics Concern  . Not on file  Social History Narrative  . Not on file   Outpatient Encounter Medications as of 12/19/2017  Medication Sig  . albuterol (PROVENTIL HFA;VENTOLIN HFA) 108 (90 BASE) MCG/ACT inhaler Inhale 2 puffs into the lungs every 4 (four) hours as needed for wheezing or shortness of breath.  .  cetirizine (ZYRTEC) 10 MG tablet Take 10 mg by mouth every morning.  Marland Kitchen esomeprazole (NEXIUM) 40 MG capsule Take 40 mg by mouth daily at 12 noon.  Marland Kitchen ibuprofen (ADVIL,MOTRIN) 200 MG tablet Take 400 mg by mouth every 6 (six) hours as needed for moderate pain.  Marland Kitchen levothyroxine (SYNTHROID, LEVOTHROID) 88 MCG tablet Take 1 tablet (88 mcg total) by mouth daily.  . methadone (DOLOPHINE) 10 MG tablet Take 10 mg by mouth every 8 (eight) hours.  . polyethylene glycol-electrolytes (NULYTELY/GOLYTELY) 420 g solution Take 4,000 mLs by mouth once.  . pregabalin (LYRICA) 75 MG capsule Take 75-150 mg by mouth at bedtime.   . [DISCONTINUED] ALPRAZolam (XANAX) 1 MG tablet Take 1 mg by mouth 3 (three) times daily.  . [DISCONTINUED] levothyroxine (SYNTHROID, LEVOTHROID) 100 MCG tablet Take 1 tablet (100 mcg total) by mouth daily.   No facility-administered encounter medications on file as of 12/19/2017.    ALLERGIES: No Known Allergies VACCINATION STATUS:  There is no immunization history on file for this patient.  HPI  57 yr old patient who is status post radioactive iodine thyroid ablation for Graves' disease in April 2012.  He is here for follow-up of area induced hypothyroidism.  He was lost to follow-up for more than 2 years.  In the interval, he has lost significant amount of weight related to his diagnosis of COPD.  He is now on regular oxygen supplement at home.   He still remains on levothyroxine 100 mcg p.o. every morning.  He denies cold intolerance, heat intolerance.  He continues to cough, still smokes heavily.    Review of Systems   Constitutional: + weight loss,  + fatigue, no subjective hyperthermia/hypothermia Eyes: no blurry vision, no xerophthalmia ENT: no sore throat, no nodules palpated in throat, no dysphagia/odynophagia, no hoarseness  Respiratory: + Cough,  +SOB Gastrointestinal: no N/V/D/C Musculoskeletal: no muscle/joint aches Skin: no rashes Neurological: no  tremors/numbness/tingling/dizziness Psychiatric: +anxiety  Objective:    BP 104/66   Pulse 62   Ht 5\' 11"  (1.803 m)   Wt 180 lb (81.6 kg)   BMI 25.10 kg/m   Wt Readings from Last 3 Encounters:  12/19/17 180 lb (81.6 kg)  07/04/16 208 lb (94.3 kg)  11/02/15 207 lb (93.9 kg)   SPO2= 91%  Physical Exam Constitutional: Lost 28 pounds since last visit. Eyes: PERRLA, EOMI, no exophthalmos ENT: moist mucous membranes, no thyromegaly, no cervical lymphadenopathy Cardiovascular: RRR, No MRG Respiratory:    + Scattered wheezes on bilateral lung fields. Musculoskeletal: no deformities, strength intact in all 4 Skin: moist, warm, no rashes   Results for orders placed or performed in visit on 11/29/17  Hemoglobin A1c  Result Value Ref Range   Hgb A1c MFr Bld 5.1 <5.7 % of total Hgb   Mean Plasma Glucose 100 (calc)   eAG (mmol/L) 5.5 (calc)  T4, Free  Result Value Ref Range   Free T4 1.4 0.8 - 1.8 ng/dL  TSH  Result Value Ref Range   TSH 0.32 (L) 0.40 - 4.50 mIU/L   Complete Blood Count (Most recent): Lab Results  Component Value Date   WBC 6.4 11/02/2015   HGB 13.5 11/02/2015   HCT 40.8 11/02/2015   MCV 87.2 11/02/2015   PLT 123 (L) 11/02/2015      Assessment & Plan:   1. 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  His recent thyroid function tests are consistent with slight over replacement.   Discussed and lowered his levothyroxine to 88 mcg p.o. every morning.     - We discussed about correct intake of levothyroxine, at fasting, with water, separated by at least 30 minutes from breakfast, and separated by more than 4 hours from calcium, iron, multivitamins, acid reflux medications (PPIs). -Patient is made aware of the fact that thyroid hormone replacement is needed for life, dose to be adjusted by periodic monitoring of thyroid function tests.    He is diagnosed with advanced COPD, on oxygen supplement at home, continues to smoke heavily.  I  have extensively counseled him to consider smoking cessation.  He will continue to follow up with his PMD for his other medical needs.  - Time spent with the patient: 25 min, of which >50% was spent in reviewing his  current and  previous labs, previous treatments, and medications doses and developing a plan for long-term care.  Laveda Abbe participated in the discussions, expressed understanding, and voiced agreement with the above plans.  All questions were answered to his satisfaction. he is encouraged to contact clinic should he have any questions or concerns prior to his return visit.  Follow up  plan: Return in about 3 months (around 03/20/2018) for follow up with pre-visit labs.  Marquis LunchGebre Chance Munter, MD Phone: [phone]  Fax: [phone]  -  This note was partially dictated with voice recognition software. Similar sounding words can be transcribed inadequately or may not  be corrected upon review.  12/19/2017, 2:05 PM

## 2018-03-11 ENCOUNTER — Other Ambulatory Visit: Payer: Self-pay | Admitting: "Endocrinology

## 2018-03-20 ENCOUNTER — Ambulatory Visit: Payer: Medicaid Other | Admitting: "Endocrinology

## 2018-03-20 ENCOUNTER — Encounter: Payer: Self-pay | Admitting: "Endocrinology

## 2018-04-04 ENCOUNTER — Other Ambulatory Visit: Payer: Self-pay | Admitting: "Endocrinology

## 2018-04-09 ENCOUNTER — Other Ambulatory Visit: Payer: Self-pay

## 2018-04-09 MED ORDER — LEVOTHYROXINE SODIUM 88 MCG PO TABS: 88.0000 ug | ORAL_TABLET | Freq: Every day | ORAL | 3 refills | Status: DC

## 2018-05-08 LAB — T4, FREE: FREE T4: 1.2 ng/dL (ref 0.8–1.8)

## 2018-05-08 LAB — TSH: TSH: 5.6 m[IU]/L — AB (ref 0.40–4.50)

## 2018-05-09 ENCOUNTER — Encounter: Payer: Self-pay | Admitting: "Endocrinology

## 2018-05-09 ENCOUNTER — Ambulatory Visit (INDEPENDENT_AMBULATORY_CARE_PROVIDER_SITE_OTHER): Payer: Medicare Other | Admitting: "Endocrinology

## 2018-05-09 VITALS — BP 116/75 | HR 69 | Ht 71.0 in | Wt 190.0 lb

## 2018-05-09 DIAGNOSIS — E890 Postprocedural hypothyroidism: Secondary | ICD-10-CM | POA: Diagnosis not present

## 2018-05-09 MED ORDER — LEVOTHYROXINE SODIUM 100 MCG PO TABS: 100.0000 ug | ORAL_TABLET | Freq: Every day | ORAL | 3 refills | Status: DC

## 2018-05-09 NOTE — Progress Notes (Signed)
Endocrinology follow-up note   Subjective:    Patient ID: Steven Larson, male    DOB: 1960/11/28,    Past Medical History:  Diagnosis Date  . Back pain   . CAD (coronary artery disease)   . Cholelithiasis   . COPD (chronic obstructive pulmonary disease) (HCC)   . Diabetes mellitus without complication [facility])    Patient states "borderline"  . Emphysema   . Hypothyroidism due to medication   . Prediabetes   . Thyroid disease    Past Surgical History:  Procedure Laterality Date  . BACK SURGERY    . CHOLECYSTECTOMY     Social History   Socioeconomic History  . Marital status: Divorced    Spouse name: Not on file  . Number of children: Not on file  . Years of education: Not on file  . Highest education level: Not on file  Occupational History  . Not on file  Social Needs  . Financial resource strain: Not on file  . Food insecurity:    Worry: Not on file    Inability: Not on file  . Transportation needs:    Medical: Not on file    Non-medical: Not on file  Tobacco Use  . Smoking status: Current Every Day Smoker    Types: Cigarettes  . Smokeless tobacco: Never Used  Substance and Sexual Activity  . Alcohol use: No  . Drug use: No  . Sexual activity: Not on file  Lifestyle  . Physical activity:    Days per week: Not on file    Minutes per session: Not on file  . Stress: Not on file  Relationships  . Social connections:    Talks on phone: Not on file    Gets together: Not on file    Attends religious service: Not on file    Active member of club or organization: Not on file    Attends meetings of clubs or organizations: Not on file    Relationship status: Not on file  Other Topics Concern  . Not on file  Social History Narrative  . Not on file   Outpatient Encounter Medications as of 05/09/2018  Medication Sig  . albuterol (PROVENTIL HFA;VENTOLIN HFA) 108 (90 BASE) MCG/ACT inhaler Inhale 2 puffs into the lungs every 4 (four) hours as needed for wheezing or  shortness of breath.  . cetirizine (ZYRTEC) 10 MG tablet Take 10 mg by mouth every morning.  Marland Kitchen esomeprazole (NEXIUM) 40 MG capsule Take 40 mg by mouth daily at 12 noon.  Marland Kitchen ibuprofen (ADVIL,MOTRIN) 200 MG tablet Take 400 mg by mouth every 6 (six) hours as needed for moderate pain.  Marland Kitchen levothyroxine (SYNTHROID, LEVOTHROID) 100 MCG tablet Take 1 tablet (100 mcg total) by mouth daily.  . methadone (DOLOPHINE) 10 MG tablet Take 10 mg by mouth every 8 (eight) hours.  . polyethylene glycol-electrolytes (NULYTELY/GOLYTELY) 420 g solution Take 4,000 mLs by mouth once.  . pregabalin (LYRICA) 75 MG capsule Take 75-150 mg by mouth at bedtime.   . [DISCONTINUED] levothyroxine (SYNTHROID, LEVOTHROID) 88 MCG tablet Take 1 tablet (88 mcg total) by mouth daily.   No facility-administered encounter medications on file as of 05/09/2018.    ALLERGIES: No Known Allergies VACCINATION STATUS:  There is no immunization history on file for this patient.  HPI  57 yr old patient who is status post radioactive iodine thyroid ablation for Graves' disease in April 2012.  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  He was  given RAI therapy for hyperthyroidism in April 2012. -He was initiated on levothyroxine subsequently and has been more or less consistent taking this medication.  However he has a history of getting lost to follow-up.    -He is currently on levothyroxine 88 mcg p.o. every morning.  He denies cold intolerance, heat intolerance.  He continues to cough, still smokes heavily.  He has gained 10 pounds since last visit.  Review of Systems   Constitutional: + weight gain,  + fatigue, continues to smoke 1 pack a day, no subjective hyperthermia/hypothermia Eyes: no blurry vision, no xerophthalmia ENT: no sore throat, no nodules palpated in throat, no dysphagia/odynophagia, no hoarseness  Respiratory: + Cough,  +SOB Gastrointestinal: no N/V/D/C Musculoskeletal: no muscle/joint aches Skin: no  rashes Neurological: no tremors/numbness/tingling/dizziness Psychiatric: +anxiety  Objective:    BP 116/75   Pulse 69   Ht 5\' 11"  (1.803 m)   Wt 190 lb (86.2 kg)   BMI 26.50 kg/m   Wt Readings from Last 3 Encounters:  05/09/18 190 lb (86.2 kg)  12/19/17 180 lb (81.6 kg)  07/04/16 208 lb (94.3 kg)   SPO2= 91%  Physical Exam Constitutional: Stable state of mind. Eyes: PERRLA, EOMI, no exophthalmos ENT: moist mucous membranes, no thyromegaly, no cervical lymphadenopathy Cardiovascular: RRR, No MRG Respiratory:    + Scattered wheezes on bilateral lung fields. Musculoskeletal: no deformities, strength intact in all 4 Skin: moist, warm, no rashes   Results for orders placed or performed in visit on 12/19/17  T4, free  Result Value Ref Range   Free T4 1.2 0.8 - 1.8 ng/dL  TSH  Result Value Ref Range   TSH 5.60 (H) 0.40 - 4.50 mIU/L   Complete Blood Count (Most recent): Lab Results  Component Value Date   WBC 6.4 11/02/2015   HGB 13.5 11/02/2015   HCT 40.8 11/02/2015   MCV 87.2 11/02/2015   PLT 123 (L) 11/02/2015      Assessment & Plan:   1. 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  Given his recent gain of 10 pounds, he would benefit from a higher dose of levothyroxine.  I discussed and increase his levothyroxine to 100 mcg p.o. every morning.    - We discussed about correct intake of levothyroxine, at fasting, with water, separated by at least 30 minutes from breakfast, and separated by more than 4 hours from calcium, iron, multivitamins, acid reflux medications (PPIs). -Patient is made aware of the fact that thyroid hormone replacement is needed for life, dose to be adjusted by periodic monitoring of thyroid function tests.    He was recently diagnosed with advanced COPD, recquired oxygen supplement at home, continues to smoke heavily.  I have extensively counseled him to consider smoking  cessation.  He will continue to follow up with his PMD for his other medical needs.   Follow up plan: Return in about 3 months (around 08/08/2018) for Follow up with Pre-visit Labs.  Marquis Lunch, MD Phone: [phone]  Fax: [phone]  -  This note was partially dictated with voice recognition software. Similar sounding words can be transcribed inadequately or may not  be corrected upon review.  05/09/2018, 12:21 PM

## 2018-07-31 ENCOUNTER — Other Ambulatory Visit: Payer: Self-pay | Admitting: "Endocrinology

## 2018-08-12 ENCOUNTER — Ambulatory Visit: Payer: Medicare Other | Admitting: "Endocrinology

## 2018-10-18 ENCOUNTER — Telehealth: Payer: Self-pay | Admitting: *Deleted

## 2018-10-18 NOTE — Telephone Encounter (Signed)
Pt called stating he has gained a lot of weight and was wondering if his levothyroxine could be increased to 100 mg?

## 2018-10-18 NOTE — Telephone Encounter (Signed)
It will be based on lab work only. He can do labs and return for a visit sooner.

## 2018-10-29 ENCOUNTER — Ambulatory Visit: Payer: Medicare Other | Admitting: "Endocrinology

## 2018-11-01 LAB — TSH: TSH: 3.19 mIU/L (ref 0.40–4.50)

## 2018-11-01 LAB — T4, FREE: Free T4: 1.2 ng/dL (ref 0.8–1.8)

## 2018-11-14 ENCOUNTER — Other Ambulatory Visit: Payer: Self-pay | Admitting: "Endocrinology

## 2018-12-19 ENCOUNTER — Other Ambulatory Visit: Payer: Self-pay | Admitting: "Endocrinology

## 2019-01-09 ENCOUNTER — Ambulatory Visit: Payer: Medicare Other | Admitting: "Endocrinology

## 2019-01-10 ENCOUNTER — Other Ambulatory Visit: Payer: Self-pay

## 2019-01-10 ENCOUNTER — Ambulatory Visit (INDEPENDENT_AMBULATORY_CARE_PROVIDER_SITE_OTHER): Payer: Medicare Other | Admitting: "Endocrinology

## 2019-01-10 ENCOUNTER — Encounter: Payer: Self-pay | Admitting: "Endocrinology

## 2019-01-10 DIAGNOSIS — F17210 Nicotine dependence, cigarettes, uncomplicated: Secondary | ICD-10-CM | POA: Insufficient documentation

## 2019-01-10 DIAGNOSIS — F17200 Nicotine dependence, unspecified, uncomplicated: Secondary | ICD-10-CM | POA: Insufficient documentation

## 2019-01-10 DIAGNOSIS — E890 Postprocedural hypothyroidism: Secondary | ICD-10-CM

## 2019-01-10 MED ORDER — LEVOTHYROXINE SODIUM 100 MCG PO TABS: 100.0000 ug | ORAL_TABLET | Freq: Every day | ORAL | 6 refills | Status: DC

## 2019-01-10 NOTE — Progress Notes (Signed)
01/10/2019                                 Endocrinology Telehealth Visit Follow up Note -During COVID -19 Pandemic  I connected with Steven Larson on 01/10/2019   by telephone and verified that I am speaking with the correct person using two identifiers. Steven Larson, 21-Sep-1960. he has verbally consented to this visit. All issues noted in this document were discussed and addressed. The format was not optimal for physical exam.   Subjective:    Patient ID: Steven Larson, male    DOB: Sep 30, 1960,    Past Medical History:  Diagnosis Date  . Back pain   . CAD (coronary artery disease)   . Cholelithiasis   . COPD (chronic obstructive pulmonary disease) (HCC)   . Diabetes mellitus without complication [facility])    Patient states "borderline"  . Emphysema   . Hypothyroidism due to medication   . Prediabetes   . Thyroid disease    Past Surgical History:  Procedure Laterality Date  . BACK SURGERY    . CHOLECYSTECTOMY     Social History   Socioeconomic History  . Marital status: Divorced    Spouse name: Not on file  . Number of children: Not on file  . Years of education: Not on file  . Highest education level: Not on file  Occupational History  . Not on file  Social Needs  . Financial resource strain: Not on file  . Food insecurity:    Worry: Not on file    Inability: Not on file  . Transportation needs:    Medical: Not on file    Non-medical: Not on file  Tobacco Use  . Smoking status: Current Every Day Smoker    Types: Cigarettes  . Smokeless tobacco: Never Used  Substance and Sexual Activity  . Alcohol use: No  . Drug use: No  . Sexual activity: Not on file  Lifestyle  . Physical activity:    Days per week: Not on file    Minutes per session: Not on file  . Stress: Not on file  Relationships  . Social connections:    Talks on phone: Not on file    Gets together: Not on file    Attends religious service: Not on file    Active member of club or organization: Not on file     Attends meetings of clubs or organizations: Not on file    Relationship status: Not on file  Other Topics Concern  . Not on file  Social History Narrative  . Not on file   Outpatient Encounter Medications as of 01/10/2019  Medication Sig  . albuterol (PROVENTIL HFA;VENTOLIN HFA) 108 (90 BASE) MCG/ACT inhaler Inhale 2 puffs into the lungs every 4 (four) hours as needed for wheezing or shortness of breath.  . cetirizine (ZYRTEC) 10 MG tablet Take 10 mg by mouth every morning.  Marland Kitchen esomeprazole (NEXIUM) 40 MG capsule Take 40 mg by mouth daily at 12 noon.  Marland Kitchen ibuprofen (ADVIL,MOTRIN) 200 MG tablet Take 400 mg by mouth every 6 (six) hours as needed for moderate pain.  Marland Kitchen levothyroxine (SYNTHROID) 100 MCG tablet Take 1 tablet (100 mcg total) by mouth daily.  . methadone (DOLOPHINE) 10 MG tablet Take 10 mg by mouth every 8 (eight) hours.  . polyethylene glycol-electrolytes (NULYTELY/GOLYTELY) 420 g solution Take 4,000 mLs by mouth once.  . pregabalin (LYRICA) 75 MG capsule  Take 75-150 mg by mouth at bedtime.   . [DISCONTINUED] levothyroxine (SYNTHROID, LEVOTHROID) 100 MCG tablet TAKE ONE TABLET BY MOUTH ONCE DAILY.   No facility-administered encounter medications on file as of 01/10/2019.    ALLERGIES: No Known Allergies VACCINATION STATUS:  There is no immunization history on file for this patient.  HPI  58 yr old patient who is status post radioactive iodine thyroid ablation for Graves' disease in April 2012.  He is engaged in telehealth via telephone for follow-up of RAI induced hypothyroidism.   -He was initiated on levothyroxine subsequently and has been more or less consistent taking this medication.  However he has a history of getting lost to follow-up.    -He is currently on levothyroxine 100 mcg p.o. every morning.  He denies cold intolerance, heat intolerance.  He continues to cough, still smokes heavily.  He has gained 10 pounds since last visit.  Review of Systems  Limited as  above.   Objective:    There were no vitals taken for this visit.  Wt Readings from Last 3 Encounters:  05/09/18 190 lb (86.2 kg)  12/19/17 180 lb (81.6 kg)  07/04/16 208 lb (94.3 kg)    Physical Exam   Results for orders placed or performed in visit on 05/09/18  TSH  Result Value Ref Range   TSH 3.19 0.40 - 4.50 mIU/L  T4, free  Result Value Ref Range   Free T4 1.2 0.8 - 1.8 ng/dL   Complete Blood Count (Most recent): Lab Results  Component Value Date   WBC 6.4 11/02/2015   HGB 13.5 11/02/2015   HCT 40.8 11/02/2015   MCV 87.2 11/02/2015   PLT 123 (L) 11/02/2015      Assessment & Plan:   1. 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  He is advised to continue levothyroxine 100 mcg p.o. every morning.    - We discussed about the correct intake of his thyroid hormone, on empty stomach at fasting, with water, separated by at least 30 minutes from breakfast and other medications,  and separated by more than 4 hours from calcium, iron, multivitamins, acid reflux medications (PPIs). -Patient is made aware of the fact that thyroid hormone replacement is needed for life, dose to be adjusted by periodic monitoring of thyroid function tests.    He was recently diagnosed with advanced COPD, recquired oxygen supplement at home, continues to smoke heavily.  At risk for COVID-19 infection.  Precautions reemphasized with him.  I have extensively counseled him to consider smoking cessation.   He will continue to follow up with his PMD for his other medical needs.  - Time spent with the patient: 20 minutes Steven Larson participated in the discussions, expressed understanding, and voiced agreement with the above plans.  All questions were answered to his satisfaction. he is encouraged to contact clinic should he have any questions or concerns prior to his return visit.  Follow up plan: Return in about 6 months (around 07/13/2019) for Follow  up with Pre-visit Labs.  Steven LunchGebre Yoskar Murrillo, MD Phone: [phone]  Fax: [phone]  -  This note was partially dictated with voice recognition software. Similar sounding words can be transcribed inadequately or may not  be corrected upon review.  01/10/2019, 9:36 AM

## 2019-04-29 ENCOUNTER — Telehealth: Payer: Self-pay | Admitting: "Endocrinology

## 2019-04-29 NOTE — Telephone Encounter (Signed)
Patient said he has been gaining weight for about 2 months. He also said he has no energy. He does not think his medication for his thyroid is not working. He takes it every morning at 4am. Please advise.

## 2019-04-29 NOTE — Telephone Encounter (Signed)
Left message for pt to call back. He will need to have labs done. His last Thyroid labs were normal.

## 2019-04-30 NOTE — Telephone Encounter (Signed)
Lft msg x 3. Awaiting return call

## 2019-05-16 ENCOUNTER — Other Ambulatory Visit: Payer: Self-pay

## 2019-05-16 DIAGNOSIS — Z20822 Contact with and (suspected) exposure to covid-19: Secondary | ICD-10-CM

## 2019-05-17 LAB — NOVEL CORONAVIRUS, NAA: SARS-CoV-2, NAA: NOT DETECTED

## 2019-05-20 ENCOUNTER — Telehealth: Payer: Self-pay

## 2019-05-20 NOTE — Telephone Encounter (Signed)
Pt. Called back, given COVID 19 results. 

## 2019-06-05 LAB — TSH: TSH: 4.1 mIU/L (ref 0.40–4.50)

## 2019-06-05 LAB — T4, FREE: Free T4: 1.2 ng/dL (ref 0.8–1.8)

## 2019-06-20 ENCOUNTER — Other Ambulatory Visit: Payer: Self-pay | Admitting: Family Medicine

## 2019-07-04 ENCOUNTER — Other Ambulatory Visit: Payer: Self-pay | Admitting: "Endocrinology

## 2019-07-04 ENCOUNTER — Other Ambulatory Visit (INDEPENDENT_AMBULATORY_CARE_PROVIDER_SITE_OTHER): Payer: Self-pay | Admitting: Internal Medicine

## 2019-07-14 ENCOUNTER — Ambulatory Visit (INDEPENDENT_AMBULATORY_CARE_PROVIDER_SITE_OTHER): Payer: Medicare Other | Admitting: "Endocrinology

## 2019-07-14 ENCOUNTER — Other Ambulatory Visit: Payer: Self-pay

## 2019-07-14 ENCOUNTER — Encounter: Payer: Self-pay | Admitting: "Endocrinology

## 2019-07-14 DIAGNOSIS — E890 Postprocedural hypothyroidism: Secondary | ICD-10-CM

## 2019-07-14 MED ORDER — LEVOTHYROXINE SODIUM 100 MCG PO TABS: 100.0000 ug | ORAL_TABLET | Freq: Every day | ORAL | 3 refills | Status: DC

## 2019-07-14 NOTE — Progress Notes (Signed)
07/14/2019                                 Endocrinology Telehealth Visit Follow up Note -During COVID -19 Pandemic  I connected with Steven Larson on 07/14/2019   by telephone and verified that I am speaking with the correct person using two identifiers. Steven Larson, Dec 07, 1960. he has verbally consented to this visit. All issues noted in this document were discussed and addressed. The format was not optimal for physical exam.   Subjective:    Patient ID: Steven Larson, male    DOB: Jul 27, 1961,    Past Medical History:  Diagnosis Date  . Back pain   . CAD (coronary artery disease)   . Cholelithiasis   . COPD (chronic obstructive pulmonary disease) (HCC)   . Diabetes mellitus without complication [facility])    Patient states "borderline"  . Emphysema   . Hypothyroidism due to medication   . Prediabetes   . Thyroid disease    Past Surgical History:  Procedure Laterality Date  . BACK SURGERY    . CHOLECYSTECTOMY     Social History   Socioeconomic History  . Marital status: Divorced    Spouse name: Not on file  . Number of children: Not on file  . Years of education: Not on file  . Highest education level: Not on file  Occupational History  . Not on file  Social Needs  . Financial resource strain: Not on file  . Food insecurity    Worry: Not on file    Inability: Not on file  . Transportation needs    Medical: Not on file    Non-medical: Not on file  Tobacco Use  . Smoking status: Current Every Day Smoker    Types: Cigarettes  . Smokeless tobacco: Never Used  Substance and Sexual Activity  . Alcohol use: No  . Drug use: No  . Sexual activity: Not on file  Lifestyle  . Physical activity    Days per week: Not on file    Minutes per session: Not on file  . Stress: Not on file  Relationships  . Social Musician on phone: Not on file    Gets together: Not on file    Attends religious service: Not on file    Active member of club or organization: Not on file   Attends meetings of clubs or organizations: Not on file    Relationship status: Not on file  Other Topics Concern  . Not on file  Social History Narrative  . Not on file   Outpatient Encounter Medications as of 07/14/2019  Medication Sig  . albuterol (PROVENTIL HFA;VENTOLIN HFA) 108 (90 BASE) MCG/ACT inhaler Inhale 2 puffs into the lungs every 4 (four) hours as needed for wheezing or shortness of breath.  . cetirizine (ZYRTEC) 10 MG tablet Take 10 mg by mouth every morning.  Marland Kitchen esomeprazole (NEXIUM) 40 MG capsule Take 40 mg by mouth daily at 12 noon.  Marland Kitchen ibuprofen (ADVIL,MOTRIN) 200 MG tablet Take 400 mg by mouth every 6 (six) hours as needed for moderate pain.  Marland Kitchen levothyroxine (SYNTHROID) 100 MCG tablet Take 1 tablet (100 mcg total) by mouth daily.  . methadone (DOLOPHINE) 10 MG tablet Take 10 mg by mouth every 8 (eight) hours.  . polyethylene glycol-electrolytes (NULYTELY/GOLYTELY) 420 g solution Take 4,000 mLs by mouth once.  . pregabalin (LYRICA) 75 MG capsule Take 75-150  mg by mouth at bedtime.   . [DISCONTINUED] levothyroxine (SYNTHROID) 100 MCG tablet TAKE ONE TABLET BY MOUTH ONCE DAILY.   No facility-administered encounter medications on file as of 07/14/2019.    ALLERGIES: No Known Allergies VACCINATION STATUS:  There is no immunization history on file for this patient.  HPI  58 yr old patient who is status post radioactive iodine thyroid ablation for Graves' disease in April 2012.  He is engaged in telehealth via telephone for follow-up of RAI induced hypothyroidism.   -He was initiated on levothyroxine subsequently and has been more or less consistent taking this medication.  However he has a history of getting lost to follow-up.    -He is currently on levothyroxine 100 mcg p.o. daily before breakfast.  More recently, he reports better consistency and compliance.     He denies cold intolerance, heat intolerance.  He continues to cough, still smokes heavily.  He has gained 10  pounds since last visit.  Review of Systems  Limited as above.   Objective:    There were no vitals taken for this visit.  Wt Readings from Last 3 Encounters:  05/09/18 190 lb (86.2 kg)  12/19/17 180 lb (81.6 kg)  07/04/16 208 lb (94.3 kg)    Physical Exam   Results for orders placed or performed in visit on 05/16/19  Novel Coronavirus, NAA ([facility])   Specimen: Oropharyngeal(OP) collection in vial transport medium   OROPHARYNGEA  TESTING  Result Value Ref Range   SARS-CoV-2, NAA Not Detected Not Detected   Complete Blood Count (Most recent): Lab Results  Component Value Date   WBC 6.4 11/02/2015   HGB 13.5 11/02/2015   HCT 40.8 11/02/2015   MCV 87.2 11/02/2015   PLT 123 (L) 11/02/2015      Assessment & Plan:   1. 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  He is advised to continue levothyroxine 100 mcg p.o. daily before breakfast    - We discussed about the correct intake of his thyroid hormone, on empty stomach at fasting, with water, separated by at least 30 minutes from breakfast and other medications,  and separated by more than 4 hours from calcium, iron, multivitamins, acid reflux medications (PPIs). -Patient is made aware of the fact that thyroid hormone replacement is needed for life, dose to be adjusted by periodic monitoring of thyroid function tests.   He was recently diagnosed with advanced COPD, recquired oxygen supplement at home, continues to smoke heavily.  At risk for COVID-19 infection.  Precautions reemphasized with him.  I have extensively counseled him to consider smoking cessation.   He will continue to follow up with his PMD for his other medical needs.   Time for this visit: 15 minutes. Lollie Marrow  participated in the discussions, expressed understanding, and voiced agreement with the above plans.  All questions were answered to his satisfaction. he is encouraged to contact clinic should he  have any questions or concerns prior to his return visit.   Follow up plan: Return in about 3 months (around 10/14/2019) for Follow up with Pre-visit Labs.  Glade Lloyd, MD Phone: [phone]  Fax: [phone]  -  This note was partially dictated with voice recognition software. Similar sounding words can be transcribed inadequately or may not  be corrected upon review.  07/14/2019, 10:29 AM

## 2019-09-30 ENCOUNTER — Other Ambulatory Visit: Payer: Self-pay

## 2019-09-30 ENCOUNTER — Ambulatory Visit: Payer: Medicare Other | Attending: Internal Medicine

## 2019-09-30 DIAGNOSIS — Z20822 Contact with and (suspected) exposure to covid-19: Secondary | ICD-10-CM

## 2019-10-01 LAB — NOVEL CORONAVIRUS, NAA: SARS-CoV-2, NAA: NOT DETECTED

## 2019-10-02 ENCOUNTER — Telehealth: Payer: Self-pay | Admitting: Family Medicine

## 2019-10-02 NOTE — Telephone Encounter (Signed)
Negative COVID results given. Patient results "NOT Detected." Caller expressed understanding. ° °

## 2019-10-15 ENCOUNTER — Other Ambulatory Visit: Payer: Self-pay | Admitting: "Endocrinology

## 2019-10-15 DIAGNOSIS — E890 Postprocedural hypothyroidism: Secondary | ICD-10-CM

## 2019-10-16 ENCOUNTER — Ambulatory Visit: Payer: Medicare Other | Admitting: "Endocrinology

## 2019-10-27 ENCOUNTER — Other Ambulatory Visit: Payer: Self-pay | Admitting: "Endocrinology

## 2019-11-11 LAB — T4, FREE: Free T4: 1.3 ng/dL (ref 0.8–1.8)

## 2019-11-11 LAB — TSH: TSH: 6.89 mIU/L — ABNORMAL HIGH (ref 0.40–4.50)

## 2019-11-17 ENCOUNTER — Other Ambulatory Visit (HOSPITAL_COMMUNITY): Payer: Self-pay | Admitting: Family Medicine

## 2019-11-17 ENCOUNTER — Ambulatory Visit (HOSPITAL_COMMUNITY): Admission: RE | Admit: 2019-11-17 | Payer: Medicare Other | Source: Ambulatory Visit

## 2019-11-17 ENCOUNTER — Other Ambulatory Visit: Payer: Self-pay | Admitting: Family Medicine

## 2019-11-17 ENCOUNTER — Encounter (HOSPITAL_COMMUNITY): Payer: Self-pay

## 2019-11-17 DIAGNOSIS — M545 Low back pain, unspecified: Secondary | ICD-10-CM

## 2019-11-17 DIAGNOSIS — R109 Unspecified abdominal pain: Secondary | ICD-10-CM

## 2019-11-25 ENCOUNTER — Encounter: Payer: Self-pay | Admitting: "Endocrinology

## 2019-11-25 ENCOUNTER — Ambulatory Visit (INDEPENDENT_AMBULATORY_CARE_PROVIDER_SITE_OTHER): Payer: Medicare Other | Admitting: "Endocrinology

## 2019-11-25 DIAGNOSIS — E890 Postprocedural hypothyroidism: Secondary | ICD-10-CM

## 2019-11-25 DIAGNOSIS — F17200 Nicotine dependence, unspecified, uncomplicated: Secondary | ICD-10-CM | POA: Diagnosis not present

## 2019-11-25 MED ORDER — LEVOTHYROXINE SODIUM 112 MCG PO TABS: 112.0000 ug | ORAL_TABLET | Freq: Every day | ORAL | 2 refills | Status: DC

## 2019-11-25 NOTE — Progress Notes (Signed)
11/25/2019                                 Endocrinology Telehealth Visit Follow up Note -During COVID -19 Pandemic  I connected with Steven Larson on 11/25/2019   by telephone and verified that I am speaking with the correct person using two identifiers. Steven Larson, 14-Oct-1960. he has verbally consented to this visit. All issues noted in this document were discussed and addressed. The format was not optimal for physical exam.   Subjective:    Patient ID: Steven Larson, male    DOB: Mar 29, 1961,    Past Medical History:  Diagnosis Date  . Back pain   . CAD (coronary artery disease)   . Cholelithiasis   . COPD (chronic obstructive pulmonary disease) ([place])   . Diabetes mellitus without complication [facility])    Patient states "borderline"  . Emphysema   . Hypothyroidism due to medication   . Prediabetes   . Thyroid disease    Past Surgical History:  Procedure Laterality Date  . BACK SURGERY    . CHOLECYSTECTOMY     Social History   Socioeconomic History  . Marital status: Divorced    Spouse name: Not on file  . Number of children: Not on file  . Years of education: Not on file  . Highest education level: Not on file  Occupational History  . Not on file  Tobacco Use  . Smoking status: Current Every Day Smoker    Types: Cigarettes  . Smokeless tobacco: Never Used  Substance and Sexual Activity  . Alcohol use: No  . Drug use: No  . Sexual activity: Not on file  Other Topics Concern  . Not on file  Social History Narrative  . Not on file   Social Determinants of Health   Financial Resource Strain:   . Difficulty of Paying Living Expenses:   Food Insecurity:   . Worried About Charity fundraiser in the Last Year:   . Arboriculturist in the Last Year:   Transportation Needs:   . Film/video editor (Medical):   Marland Kitchen Lack of Transportation (Non-Medical):   Physical Activity:   . Days of Exercise per Week:   . Minutes of Exercise per Session:   Stress:   . Feeling of  Stress :   Social Connections:   . Frequency of Communication with Friends and Family:   . Frequency of Social Gatherings with Friends and Family:   . Attends Religious Services:   . Active Member of Clubs or Organizations:   . Attends Archivist Meetings:   Marland Kitchen Marital Status:    Outpatient Encounter Medications as of 11/25/2019  Medication Sig  . albuterol (PROVENTIL HFA;VENTOLIN HFA) 108 (90 BASE) MCG/ACT inhaler Inhale 2 puffs into the lungs every 4 (four) hours as needed for wheezing or shortness of breath.  . cetirizine (ZYRTEC) 10 MG tablet Take 10 mg by mouth every morning.  Marland Kitchen esomeprazole (NEXIUM) 40 MG capsule Take 40 mg by mouth daily at 12 noon.  Marland Kitchen ibuprofen (ADVIL,MOTRIN) 200 MG tablet Take 400 mg by mouth every 6 (six) hours as needed for moderate pain.  Marland Kitchen levothyroxine (SYNTHROID) 112 MCG tablet Take 1 tablet (112 mcg total) by mouth daily before breakfast.  . methadone (DOLOPHINE) 10 MG tablet Take 10 mg by mouth every 8 (eight) hours.  . polyethylene glycol-electrolytes (NULYTELY/GOLYTELY) 420 g solution Take 4,000 mLs  by mouth once.  . pregabalin (LYRICA) 75 MG capsule Take 75-150 mg by mouth at bedtime.   . [DISCONTINUED] levothyroxine (SYNTHROID) 100 MCG tablet TAKE ONE TABLET BY MOUTH ONCE DAILY.   No facility-administered encounter medications on file as of 11/25/2019.   ALLERGIES: No Known Allergies VACCINATION STATUS:  There is no immunization history on file for this patient.  HPI  59 yr old patient who is status post radioactive iodine thyroid ablation for Graves' disease in April 2012.  He is engaged in telehealth via telephone for follow-up of RAI induced hypothyroidism.   -He was initiated on levothyroxine subsequently and has been more or less consistent taking this medication.  He is currently on levothyroxine 100 mcg p.o. daily before breakfast.  Has no new complaints.  He has gained approximately 15 pounds since last visit.    He denies cold  intolerance, heat intolerance.  He continues to cough, still smokes heavily.  He has gained 10 pounds since last visit.  Review of Systems  Limited as above.   Objective:    There were no vitals taken for this visit.  Wt Readings from Last 3 Encounters:  05/09/18 190 lb (86.2 kg)  12/19/17 180 lb (81.6 kg)  07/04/16 208 lb (94.3 kg)    Physical Exam   Results for orders placed or performed in visit on 10/15/19  TSH  Result Value Ref Range   TSH 6.89 (H) 0.40 - 4.50 mIU/L  T4, Free  Result Value Ref Range   Free T4 1.3 0.8 - 1.8 ng/dL   Complete Blood Count (Most recent): Lab Results  Component Value Date   WBC 6.4 11/02/2015   HGB 13.5 11/02/2015   HCT 40.8 11/02/2015   MCV 87.2 11/02/2015   PLT 123 (L) 11/02/2015     Assessment & Plan:   1. 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  Graves' disease-resolved His previsit labs are consistent with under replacement.  She would benefit from slight increase in his levothyroxine.  I discussed and increased his levothyroxine to 112 mcg p.o. daily before breakfast.    - We discussed about the correct intake of his thyroid hormone, on empty stomach at fasting, with water, separated by at least 30 minutes from breakfast and other medications,  and separated by more than 4 hours from calcium, iron, multivitamins, acid reflux medications (PPIs). -Patient is made aware of the fact that thyroid hormone replacement is needed for life, dose to be adjusted by periodic monitoring of thyroid function tests.  He is a chronic heavy smoker,  recently diagnosed with advanced COPD, recquired oxygen supplement at home, continues to smoke heavily.  The patient was counseled on the dangers of tobacco use, and was advised to quit.  Reviewed strategies to maximize success, including removing cigarettes and smoking materials from environment.    He will continue to follow up with his PMD for his other medical needs.     - Time spent on this patient care  encounter:  25 minutes of which 50% was spent in  counseling and the rest reviewing  his current and  previous labs / studies and medications  doses and developing a plan for long term care. Laveda Abbe  participated in the discussions, expressed understanding, and voiced agreement with the above plans.  All questions were answered to his satisfaction. he is encouraged to contact clinic should he have any questions or concerns prior to his return visit.   Follow up plan: Return in about 3 months (  around 02/25/2020) for Follow up with Pre-visit Labs.  Marquis Lunch, MD Phone: [phone]  Fax: [phone]  -  This note was partially dictated with voice recognition software. Similar sounding words can be transcribed inadequately or may not  be corrected upon review.  11/25/2019, 3:10 PM

## 2020-01-22 ENCOUNTER — Other Ambulatory Visit: Payer: Self-pay | Admitting: "Endocrinology

## 2020-02-27 ENCOUNTER — Ambulatory Visit: Payer: Medicare Other | Admitting: "Endocrinology

## 2020-03-20 ENCOUNTER — Other Ambulatory Visit: Payer: Self-pay | Admitting: "Endocrinology

## 2020-03-22 LAB — T4, FREE: Free T4: 1.4 ng/dL (ref 0.8–1.8)

## 2020-03-22 LAB — TSH: TSH: 0.62 mIU/L (ref 0.40–4.50)

## 2020-03-30 ENCOUNTER — Encounter: Payer: Self-pay | Admitting: Internal Medicine

## 2020-03-30 ENCOUNTER — Other Ambulatory Visit: Payer: Self-pay

## 2020-03-30 ENCOUNTER — Ambulatory Visit (INDEPENDENT_AMBULATORY_CARE_PROVIDER_SITE_OTHER): Payer: Medicare Other | Admitting: Internal Medicine

## 2020-03-30 ENCOUNTER — Ambulatory Visit: Payer: Medicare Other | Admitting: "Endocrinology

## 2020-03-30 DIAGNOSIS — J9611 Chronic respiratory failure with hypoxia: Secondary | ICD-10-CM | POA: Insufficient documentation

## 2020-03-30 DIAGNOSIS — F17210 Nicotine dependence, cigarettes, uncomplicated: Secondary | ICD-10-CM | POA: Diagnosis not present

## 2020-03-30 DIAGNOSIS — J449 Chronic obstructive pulmonary disease, unspecified: Secondary | ICD-10-CM

## 2020-03-30 MED ORDER — TRELEGY ELLIPTA 100-62.5-25 MCG/INH IN AEPB: 1.0000 | INHALATION_SPRAY | Freq: Every day | RESPIRATORY_TRACT | 0 refills | Status: DC

## 2020-03-30 NOTE — Assessment & Plan Note (Addendum)
Active smoker  - 03/30/2020  After extensive coaching inhaler device,  effectiveness =    90% p coaching elipta, 50% p hfa both vs baseline nearly 0  - alpha one AT screen rec 03/30/2020 > did not go to lab  DDX of  difficult airways management almost all start with A and  include Adherence, Ace Inhibitors, Acid Reflux, Active Sinus Disease, Alpha 1 Antitripsin deficiency, Anxiety masquerading as Airways dz,  ABPA,  Allergy(esp in young), Aspiration (esp in elderly), Adverse effects of meds,  Active smoking or vaping, A bunch of PE's (a small clot burden can't cause this syndrome unless there is already severe underlying pulm or vascular dz with poor reserve) plus two Bs  = Bronchiectasis and Beta blocker use..and one C= CHF   Adherence is always the initial "prime suspect" and is a multilayered concern that requires a "trust but verify" approach in every patient - starting with knowing how to use medications, especially inhalers, correctly, keeping up with refills and understanding the fundamental difference between maintenance and prns vs those medications only taken for a very short course and then stopped and not refilled.  - see hfa teaching  - if wants my help will need to return with all meds in hand using a trust but verify approach to confirm accurate Medication  Reconciliation The principal here is that until we are certain that the  patients are doing what we've asked, it makes no sense to ask them to do more.   Active smoking clearly the other chief suspect   ? Alpha one at > needs screening but did not go to lab  ? Anxiety/depression/ deconditioning  > usually at the bottom of this list of usual suspects but should be much higher on this pt's based on H and P and note already on psychotropics/ narcs and may interfere with adherence and also interpretation of response or lack thereof to symptom management which can be quite subjective.   ? Acid (or non-acid) GERD > always difficult to  exclude as up to 75% of pts in some series report no assoc GI/ Heartburn symptoms> rec continue nexium  acid suppression and diet restrictions/ reviewed   ? Adverse drug effects > none of the usual suspects listed

## 2020-03-30 NOTE — Patient Instructions (Addendum)
Adjust your 02 flow to keep the saturation above 90% at all times  Plan A = Automatic = Always=    Trelegy one click and two good drags   Hold for  5 seconds and out thru nose   Plan B = Backup (to supplement plan A, not to replace it) Only use your albuterol inhaler as a rescue medication to be used if you can't catch your breath by resting or doing a relaxed purse lip breathing pattern.  - The less you use it, the better it will work when you need it. - Ok to use the inhaler up to 2 puffs  every 4 hours if you must but call for appointment if use goes up over your usual need - Don't leave home without it !!  (think of it like the spare tire for your car)   Plan C = Crisis (instead of Plan B but only if Plan B stops working) - only use your albuterol nebulizer if you first try Plan B and it fails to help > ok to use the nebulizer up to every 4 hours but if start needing it regularly call for immediate appointment    Please remember to go to the lab and x-ray department at [facility]  for your tests - we will call you with the results when they are available.   Please schedule a follow up office visit in 2 weeks, sooner if needed

## 2020-03-30 NOTE — Assessment & Plan Note (Signed)
Counseled re importance of smoking cessation but did not meet time criteria for separate billing   °

## 2020-03-30 NOTE — Progress Notes (Signed)
Steven Larson, male    DOB: Jul 03, 1961,    MRN: [id number]   Brief patient profile:  53 yowm active smoker with onset around 2006-7 progressive doe using more saba than maint and wrose x June 2021 and self referred to pulmonary clinic 03/30/2020 p spoke to one of my pts wearing POC and specifically set up appt to get one     History of Present Illness  03/30/2020  Pulmonary/ 1st office eval/ Steven Larson / [facility] Office  Chief Complaint  Patient presents with  . Consult    shortness of breath with exertion, productive cough with clear and some black phlegm  Dyspnea:  25-50 ft a lot better while on 02 but says was told not to use it (I suspect at that point his sats were > 90% and he has not been monitoring his)  Cough: dark thick mucus each am x 5-10 min then just rattles  Sleep: bed flat sev pillows wakes up daily  6 weeks SABA use: completely inverted saba neb with hfa with maint trelegy / both his ventolins were on zero. 02  2.5 lpm hs and not using much at all datyime    No obvious day to day or daytime variability or assoc   mucus plugs or hemoptysis or cp or chest tightness, subjective wheeze or overt sinus or hb symptoms.     Also denies any obvious fluctuation of symptoms with weather or environmental changes or other aggravating or alleviating factors except as outlined above   No unusual exposure hx or h/o childhood pna/ asthma or knowledge of premature birth.  Current Allergies, Complete Past Medical History, Past Surgical History, Family History, and Social History were reviewed in [employer] record.  ROS  The following are not active complaints unless bolded Hoarseness, sore throat, dysphagia, dental problems, itching, sneezing,  nasal congestion or discharge of excess mucus or purulent secretions, ear ache,   fever, chills, sweats, unintended wt loss or wt gain, classically pleuritic or exertional cp,  orthopnea pnd or arm/hand swelling  or leg swelling,  presyncope, palpitations, abdominal pain, anorexia, nausea, vomiting, diarrhea  or change in bowel habits or change in bladder habits, change in stools or change in urine, dysuria, hematuria,  rash, arthralgias, visual complaints, headache, numbness, weakness or ataxia or problems with walking or coordination,  change in mood or  memory.               Past Medical History:  Diagnosis Date  . Back pain   . CAD (coronary artery disease)   . Cholelithiasis   . COPD (chronic obstructive pulmonary disease) (HCC)   . Diabetes mellitus without complication [facility])    Patient states "borderline"  . Emphysema   . Hypothyroidism due to medication   . Prediabetes   . Thyroid disease     Outpatient Medications Prior to Visit - - NOTE:   Unable to verify as accurately reflecting what pt takes  - also on pred that he did not disclose     Medication Sig Dispense Refill  . albuterol (PROVENTIL HFA;VENTOLIN HFA) 108 (90 BASE) MCG/ACT inhaler Inhale 2 puffs into the lungs every 4 (four) hours as needed for wheezing or shortness of breath. 1 Inhaler 0  . albuterol (PROVENTIL) (2.5 MG/3ML) 0.083% nebulizer solution Take 2.5 mg by nebulization 4 (four) times daily.    . Fluticasone-Umeclidin-Vilant (TRELEGY ELLIPTA) 100-62.5-25 MCG/INH AEPB     . ibuprofen (ADVIL,MOTRIN) 200 MG tablet Take 400 mg by mouth  every 6 (six) hours as needed for moderate pain.    Marland Kitchen levETIRAcetam (KEPPRA) 500 MG tablet Take 500 mg by mouth 2 (two) times daily.    Marland Kitchen levothyroxine (SYNTHROID) 112 MCG tablet TAKE 1 TABLET BEFORE BREAKFAST. 90 tablet 0  . methadone (DOLOPHINE) 10 MG tablet Take 10 mg by mouth every 8 (eight) hours.    . pregabalin (LYRICA) 75 MG capsule Take 75-150 mg by mouth at bedtime.     . cetirizine (ZYRTEC) 10 MG tablet Take 10 mg by mouth every morning. (Patient not taking: Reported on 03/30/2020)    . esomeprazole (NEXIUM) 40 MG capsule Take 40 mg by mouth daily at 12 noon. (Patient not taking: Reported on  03/30/2020)    . polyethylene glycol-electrolytes (NULYTELY/GOLYTELY) 420 g solution Take 4,000 mLs by mouth once. 4000 mL 0   No facility-administered medications prior to visit.     Objective:     BP (!) 144/80 (BP Location: Left Arm, Cuff Size: Normal)   Pulse 88   Temp 99.9 F (37.7 C) (Oral)   Ht 6' 1.5" (1.867 m)   Wt (!) 205 lb 3.2 oz (93.1 kg)   SpO2 (!) 86% Comment: Room air  BMI 26.71 kg/m   SpO2: (!) 86 % (Room air)   Disheveled amb wm >> stated age  HEENT : pt wearing mask not removed for exam due to covid -19 concerns.    NECK :  without JVD/Nodes/TM/ nl carotid upstrokes bilaterally   LUNGS: no acc muscle use,  Mod barrel  contour chest wall with bilateral  Distant bs s audible wheeze and  without cough on insp or exp maneuvers and mod  Hyperresonant  to  percussion bilaterally     CV:  RRR  no s3 or murmur or increase in P2, and no edema   ABD:  soft and nontender with pos mid insp Hoover's  in the supine position. No bruits or organomegaly appreciated, bowel sounds nl  MS:     ext warm without deformities, calf tenderness, cyanosis  - ? Mild clubbing No obvious joint restrictions   SKIN: warm and dry without lesions    NEURO:  alert, approp, nl sensorium with  no motor or cerebellar deficits apparent.        CXR PA and Lateral:   03/30/2020 :    I personally reviewed images and agree with radiology impression as follows:   Did not go to xray    Labs ordered 03/30/2020  :      alpha one AT phenotype  - did  Not go to lab     Assessment   COPD GOLD ? / clinically group D  Active smoker  - 03/30/2020  After extensive coaching inhaler device,  effectiveness =    90% p coaching elipta, 50% p hfa both vs baseline nearly 0  - alpha one AT screen rec 03/30/2020 > did not go to lab  DDX of  difficult airways management almost all start with A and  include Adherence, Ace Inhibitors, Acid Reflux, Active Sinus Disease, Alpha 1 Antitripsin deficiency, Anxiety  masquerading as Airways dz,  ABPA,  Allergy(esp in young), Aspiration (esp in elderly), Adverse effects of meds,  Active smoking or vaping, A bunch of PE's (a small clot burden can't cause this syndrome unless there is already severe underlying pulm or vascular dz with poor reserve) plus two Bs  = Bronchiectasis and Beta blocker use..and one C= CHF   Adherence is always the  initial "prime suspect" and is a multilayered concern that requires a "trust but verify" approach in every patient - starting with knowing how to use medications, especially inhalers, correctly, keeping up with refills and understanding the fundamental difference between maintenance and prns vs those medications only taken for a very short course and then stopped and not refilled.  - see hfa teaching  - if wants my help will need to return with all meds in hand using a trust but verify approach to confirm accurate Medication  Reconciliation The principal here is that until we are certain that the  patients are doing what we've asked, it makes no sense to ask them to do more.   Active smoking clearly the other chief suspect   ? Alpha one at > needs screening but did not go to lab  ? Anxiety/depression/ deconditioning  > usually at the bottom of this list of usual suspects but should be much higher on this pt's based on H and P and note already on psychotropics/ narcs and may interfere with adherence and also interpretation of response or lack thereof to symptom management which can be quite subjective.   ? Acid (or non-acid) GERD > always difficult to exclude as up to 75% of pts in some series report no assoc GI/ Heartburn symptoms> rec continue nexium  acid suppression and diet restrictions/ reviewed   ? Adverse drug effects > none of the usual suspects listed       Cigarette smoker Counseled re importance of smoking cessation but did not meet time criteria for separate billing      Chronic respiratory failure with  hypoxia (HCC) sats 86% on arrival 1st pulmonary eval 03/30/2020   With sats so low on RA it is unlikely will qualify for POC for now and advised to return p we've seen what we can do to improve his breathing which is really a separate but related issued.   Advised: 02 2.5 lpm hs ok fo now but make sure to  check   oxygen saturations at highest level of activity to be sure it stays over 90% and adjust upward to maintain this level if needed but remember to turn it back to previous settings when you stop (to conserve your supply).        Each maintenance medication was reviewed in detail including emphasizing most importantly the difference between maintenance and prns and under what circumstances the prns are to be triggered using an action plan format where appropriate.  Total time for H and P, chart review, counseling, teaching device and generating customized AVS unique to this office visit / charting = 46 min          Sandrea Hughs, MD 03/30/2020

## 2020-03-30 NOTE — Assessment & Plan Note (Signed)
sats 86% on arrival 1st pulmonary eval 03/30/2020   With sats so low on RA it is unlikely will qualify for POC for now and advised to return p we've seen what we can do to improve his breathing which is really a separate but related issued.   Advised: 02 2.5 lpm hs ok fo now but make sure to  check   oxygen saturations at highest level of activity to be sure it stays over 90% and adjust upward to maintain this level if needed but remember to turn it back to previous settings when you stop (to conserve your supply).           Each maintenance medication was reviewed in detail including emphasizing most importantly the difference between maintenance and prns and under what circumstances the prns are to be triggered using an action plan format where appropriate.  Total time for H and P, chart review, counseling, teaching device and generating customized AVS unique to this office visit / charting = 46 min

## 2020-03-31 ENCOUNTER — Other Ambulatory Visit: Payer: Self-pay

## 2020-03-31 ENCOUNTER — Ambulatory Visit (HOSPITAL_COMMUNITY)
Admission: RE | Admit: 2020-03-31 | Discharge: 2020-03-31 | Disposition: A | Discharge status: Home / Self Care | Payer: Medicare Other | Source: Ambulatory Visit | Attending: Internal Medicine | Admitting: Internal Medicine

## 2020-03-31 DIAGNOSIS — J449 Chronic obstructive pulmonary disease, unspecified: Secondary | ICD-10-CM | POA: Insufficient documentation

## 2020-04-01 NOTE — Progress Notes (Signed)
Left detailed msg on machine with results ok per DPR

## 2020-04-16 ENCOUNTER — Ambulatory Visit: Payer: Medicare Other | Admitting: Internal Medicine

## 2020-06-29 ENCOUNTER — Other Ambulatory Visit: Payer: Self-pay

## 2020-06-29 ENCOUNTER — Other Ambulatory Visit: Payer: Medicare Other

## 2020-06-29 DIAGNOSIS — Z20822 Contact with and (suspected) exposure to covid-19: Secondary | ICD-10-CM

## 2020-06-30 ENCOUNTER — Telehealth: Payer: Self-pay | Admitting: "Endocrinology

## 2020-06-30 LAB — NOVEL CORONAVIRUS, NAA: SARS-CoV-2, NAA: NOT DETECTED

## 2020-06-30 LAB — SARS-COV-2, NAA 2 DAY TAT

## 2020-06-30 NOTE — Telephone Encounter (Signed)
Can you please schedule?

## 2020-06-30 NOTE — Telephone Encounter (Signed)
Pt called and has not been since March and his last labs were done in July. He wants to sch a follow up, does patient need labs before appt.

## 2020-06-30 NOTE — Telephone Encounter (Signed)
Please advise 

## 2020-06-30 NOTE — Telephone Encounter (Signed)
We can have him on a phone visit. Ok with July labs.

## 2020-07-01 NOTE — Telephone Encounter (Signed)
sch for 11/1

## 2020-07-05 ENCOUNTER — Telehealth (INDEPENDENT_AMBULATORY_CARE_PROVIDER_SITE_OTHER): Payer: Medicare Other | Admitting: "Endocrinology

## 2020-07-05 ENCOUNTER — Encounter: Payer: Self-pay | Admitting: "Endocrinology

## 2020-07-05 DIAGNOSIS — E890 Postprocedural hypothyroidism: Secondary | ICD-10-CM

## 2020-07-05 MED ORDER — LEVOTHYROXINE SODIUM 112 MCG PO TABS: ORAL_TABLET | ORAL | 1 refills | Status: AC

## 2020-07-05 NOTE — Progress Notes (Signed)
07/05/2020                                     Endocrinology Telehealth Visit Follow up Note -During COVID -19 Pandemic  This visit type was conducted  via telephone due to national recommendations for restrictions regarding the COVID-19 Pandemic  in an effort to limit this patient's exposure and mitigate transmission of the corona virus.   I connected with Steven Larson on 07/05/2020   by telephone and verified that I am speaking with the correct person using two identifiers. Steven Larson, [birth year]. he has verbally consented to this visit.  I was in my office and patient was in his residence. No other persons were with me during the encounter. All issues noted in this document were discussed and addressed. The format was not optimal for physical exam.    Subjective:    Patient ID: Steven Larson, male    DOB: 09/27/1960,    Past Medical History:  Diagnosis Date  . Back pain   . CAD (coronary artery disease)   . Cholelithiasis   . COPD (chronic obstructive pulmonary disease) (HCC)   . Diabetes mellitus without complication [facility])    Patient states "borderline"  . Emphysema   . Hypothyroidism due to medication   . Prediabetes   . Thyroid disease    Past Surgical History:  Procedure Laterality Date  . BACK SURGERY    . CHOLECYSTECTOMY     Social History   Socioeconomic History  . Marital status: Divorced    Spouse name: Not on file  . Number of children: Not on file  . Years of education: Not on file  . Highest education level: Not on file  Occupational History  . Not on file  Tobacco Use  . Smoking status: Current Every Day Smoker    Types: Cigarettes  . Smokeless tobacco: Never Used  . Tobacco comment: 3-4 cigarettes per day  Vaping Use  . Vaping Use: Never used  Substance and Sexual Activity  . Alcohol use: No  . Drug use: No  . Sexual activity: Not on file  Other Topics Concern  . Not on file  Social History Narrative  . Not on file   Social Determinants of  Health   Financial Resource Strain:   . Difficulty of Paying Living Expenses: Not on file  Food Insecurity:   . Worried About Programme researcher, broadcasting/film/video in the Last Year: Not on file  . Ran Out of Food in the Last Year: Not on file  Transportation Needs:   . Lack of Transportation (Medical): Not on file  . Lack of Transportation (Non-Medical): Not on file  Physical Activity:   . Days of Exercise per Week: Not on file  . Minutes of Exercise per Session: Not on file  Stress:   . Feeling of Stress : Not on file  Social Connections:   . Frequency of Communication with Friends and Family: Not on file  . Frequency of Social Gatherings with Friends and Family: Not on file  . Attends Religious Services: Not on file  . Active Member of Clubs or Organizations: Not on file  . Attends Banker Meetings: Not on file  . Marital Status: Not on file   Outpatient Encounter Medications as of 07/05/2020  Medication Sig  . albuterol (PROVENTIL HFA;VENTOLIN HFA) 108 (90 BASE) MCG/ACT inhaler Inhale 2 puffs into the lungs every  4 (four) hours as needed for wheezing or shortness of breath.  Marland Kitchen albuterol (PROVENTIL) (2.5 MG/3ML) 0.083% nebulizer solution Take 2.5 mg by nebulization 4 (four) times daily.  . Fluticasone-Umeclidin-Vilant (TRELEGY ELLIPTA) 100-62.5-25 MCG/INH AEPB   . ibuprofen (ADVIL,MOTRIN) 200 MG tablet Take 400 mg by mouth every 6 (six) hours as needed for moderate pain.  Marland Kitchen levETIRAcetam (KEPPRA) 500 MG tablet Take 500 mg by mouth 2 (two) times daily.  Marland Kitchen levothyroxine (SYNTHROID) 112 MCG tablet TAKE 1 TABLET BEFORE BREAKFAST.  . methadone (DOLOPHINE) 10 MG tablet Take 10 mg by mouth every 8 (eight) hours.  . pregabalin (LYRICA) 75 MG capsule Take 75-150 mg by mouth at bedtime.   . [DISCONTINUED] cetirizine (ZYRTEC) 10 MG tablet Take 10 mg by mouth every morning. (Patient not taking: Reported on 03/30/2020)  . [DISCONTINUED] esomeprazole (NEXIUM) 40 MG capsule Take 40 mg by mouth daily at  12 noon. (Patient not taking: Reported on 03/30/2020)  . [DISCONTINUED] levothyroxine (SYNTHROID) 112 MCG tablet TAKE 1 TABLET BEFORE BREAKFAST.   No facility-administered encounter medications on file as of 07/05/2020.   ALLERGIES: No Known Allergies VACCINATION STATUS:  There is no immunization history on file for this patient.  HPI  59 year old male patient  who is status post radioactive iodine thyroid ablation for Graves' disease in April 2012.  He is engaged in telehealth via telephone for follow-up of RAI induced hypothyroidism.    -He was initiated on levothyroxine subsequently and has been more or less consistent taking this medication.  He is currently on levothyroxine 112 mcg p.o. daily before breakfast.  He reports compliance to this medication.  He has no new complaints today.   He reports that he has gained 10 pounds since last visit, known to have fluctuating body weight.  He denies cold intolerance, heat intolerance.  He continues to cough, still smokes heavily.    Review of Systems  Limited as above.   Objective:    There were no vitals taken for this visit.  Wt Readings from Last 3 Encounters:  03/30/20 (!) 205 lb 3.2 oz (93.1 kg)  05/09/18 190 lb (86.2 kg)  12/19/17 180 lb (81.6 kg)    Physical Exam   Results for orders placed or performed in visit on 06/29/20  Novel Coronavirus, NAA ([facility])   Specimen: Nasopharyngeal(NP) swabs in vial transport medium   Nasopharynge  Result Value Ref Range   SARS-CoV-2, NAA Not Detected Not Detected  SARS-COV-2, NAA 2 DAY TAT   Nasopharynge  Result Value Ref Range   SARS-CoV-2, NAA 2 DAY TAT Performed    Complete Blood Count (Most recent): Lab Results  Component Value Date   WBC 6.4 11/02/2015   HGB 13.5 11/02/2015   HCT 40.8 11/02/2015   MCV 87.2 11/02/2015   PLT 123 (L) 11/02/2015     Assessment & Plan:   1. 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  Graves' disease-resolved His previsit labs are consistent with  appropriate replacement.  He is advised to continue levothyroxine 112 mcg p.o. daily before breakfast.    - We discussed about the correct intake of his thyroid hormone, on empty stomach at fasting, with water, separated by at least 30 minutes from breakfast and other medications,  and separated by more than 4 hours from calcium, iron, multivitamins, acid reflux medications (PPIs). -Patient is made aware of the fact that thyroid hormone replacement is needed for life, dose to be adjusted by periodic monitoring of thyroid function tests.  He is a chronic  heavy smoker,  recently diagnosed with advanced COPD, recquired oxygen supplement at home, continues to smoke heavily.  The patient was counseled on the dangers of tobacco use, and was advised to quit.  Reviewed strategies to maximize success, including removing cigarettes and smoking materials from environment.    He will continue to follow up with his PMD for his other medical needs.      - Time spent on this patient care encounter:  20 minutes of which 50% was spent in  counseling and the rest reviewing  his current and  previous labs / studies and medications  doses and developing a plan for long term care. Laveda Abbe  participated in the discussions, expressed understanding, and voiced agreement with the above plans.  All questions were answered to his satisfaction. he is encouraged to contact clinic should he have any questions or concerns prior to his return visit.    Follow up plan: Return in about 3 months (around 10/05/2020) for F/U with Pre-visit Labs.  Marquis Lunch, MD Phone: [phone]  Fax: [phone]  -  This note was partially dictated with voice recognition software. Similar sounding words can be transcribed inadequately or may not  be corrected upon review.  07/05/2020, 2:22 PM

## 2020-07-09 ENCOUNTER — Telehealth: Payer: Self-pay | Admitting: Internal Medicine
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
## 2020-07-09 NOTE — Telephone Encounter (Signed)
Yes, his sats was 86% on RA on 03/30/20 while in stable ambulatory setting and had 2.5 lpm to use HS at that point so should be able to get 02 until we see him and regroup

## 2020-07-12 NOTE — Telephone Encounter (Signed)
Attempted to call pt but unable to reach. Left message for him to return call. °

## 2020-07-14 NOTE — Telephone Encounter (Signed)
LMTCB for the pt 

## 2020-07-14 NOTE — Telephone Encounter (Signed)
Dr. Sherene Sires, I do not see record of walk test from last OV. Unfortunately a walk test is needed in order to qualify patient.  Would you like to do SMW prior to 08/03/2020 OV?

## 2020-07-14 NOTE — Telephone Encounter (Signed)
Return to qualify for amb 02

## 2020-07-16 NOTE — Telephone Encounter (Signed)
ATC patient unable to reach LM to call back office (x2) Will route to [employer] pool  Patient needs qualifying walk , please schedule on 6 min walk schedule and put in appt notes qualifying walk

## 2020-07-19 ENCOUNTER — Telehealth: Payer: Self-pay | Admitting: Internal Medicine

## 2020-07-19 NOTE — Telephone Encounter (Signed)
LMTC x 1 - We had called pt regarding him needing a qualifying walk for [employer]. Per Verlon Au they can do the walk when he comes to See Dr Sherene Sires in [place] on 08/03/20.

## 2020-08-03 ENCOUNTER — Other Ambulatory Visit: Payer: Self-pay

## 2020-08-03 ENCOUNTER — Ambulatory Visit (INDEPENDENT_AMBULATORY_CARE_PROVIDER_SITE_OTHER): Payer: Medicare Other | Admitting: Internal Medicine

## 2020-08-03 ENCOUNTER — Encounter: Payer: Self-pay | Admitting: Internal Medicine

## 2020-08-03 DIAGNOSIS — J449 Chronic obstructive pulmonary disease, unspecified: Secondary | ICD-10-CM | POA: Diagnosis not present

## 2020-08-03 DIAGNOSIS — R0600 Dyspnea, unspecified: Secondary | ICD-10-CM

## 2020-08-03 DIAGNOSIS — R0609 Other forms of dyspnea: Secondary | ICD-10-CM | POA: Insufficient documentation

## 2020-08-03 DIAGNOSIS — J9611 Chronic respiratory failure with hypoxia: Secondary | ICD-10-CM | POA: Diagnosis not present

## 2020-08-03 DIAGNOSIS — F17210 Nicotine dependence, cigarettes, uncomplicated: Secondary | ICD-10-CM

## 2020-08-03 MED ORDER — METHYLPREDNISOLONE ACETATE 80 MG/ML IJ SUSP
120.0000 mg | Freq: Once | INTRAMUSCULAR | Status: AC
  Administered 2020-08-03: 120 mg via INTRAMUSCULAR

## 2020-08-03 MED ORDER — CEFDINIR 300 MG PO CAPS: 300.0000 mg | ORAL_CAPSULE | Freq: Two times a day (BID) | ORAL | 0 refills | Status: AC

## 2020-08-03 NOTE — Progress Notes (Signed)
Steven Larson, male    DOB: 1960-12-18,    MRN: [id number]   Brief patient profile:  64 yowm active smoker with onset around 2006-7 progressive doe using more saba than maint and worse x June 2021 and self referred to pulmonary clinic 03/30/2020 p spoke to one of my pts wearing POC and specifically set up appt to get one     History of Present Illness  03/30/2020  Pulmonary/ 1st office eval/ Steven Larson / [facility]  Chief Complaint  Patient presents with  . Consult    shortness of breath with exertion, productive cough with clear and some black phlegm  Dyspnea:  25-50 ft a lot better while on 02 but says was told not to use it (I suspect at that point his sats were > 90% and he has not been monitoring his)  Cough: dark thick mucus each am x 5-10 min then just rattles  Sleep: bed flat sev pillows wakes up daily  6 weeks SABA use: completely inverted saba neb with hfa with maint trelegy / both his ventolins were on zero. 02  2.5 lpm hs and not using much at all datyime rec Adjust your 02 flow to keep the saturation above 90% at all times Plan A = Automatic = Always=    Trelegy one click and two good drags  Hold for  5 seconds and out thru nose  Plan B = Backup (to supplement plan A, not to replace it) Only use your albuterol inhaler as a rescue medication  Plan C = Crisis (instead of Plan B but only if Plan B stops working) - only use your albuterol nebulizer if you first try Plan B and it fails to help > ok to use the nebulizer up to every 4 hours but if start needing it regularly call for immediate appointment go to the lab and [facility] department at [facility]  for your tests > did not go for studies                08/03/2020  f/u ov/[place] office/Steven Larson re:  Copd ? GOLD / needs to qualify for amb 02 / needs alpha one / trelegy one daily / still smoking  Chief Complaint  Patient presents with  . Follow-up    Breathing has been worse over the past 2-3 months. He has wheezing and cough  first thing in the am, sometimes wakes him up. His cough is prod with brown/red/black sputum.   Dyspnea:  Room to room  = 100 ft in office even on adequate amb 02 flow documented at 2lpm cont  Cough: some cough in am / dark (color blind) rattle  No change x sev years  Sleeping:bed is flat/ 30 degrees with pillows  SABA use:  Only uses p activity / both hfa 4-5 /day and neb 3 x d 02:  2.5 lpm    24/7     No obvious day to day or daytime variability or assoc mucus plugs or hemoptysis or cp or chest tightness, subjective wheeze or overt sinus or hb symptoms.   Sleeping as above  without nocturnal    exacerbation  of respiratory  c/o's or need for noct saba. Also denies any obvious fluctuation of symptoms with weather or environmental changes or other aggravating or alleviating factors except as outlined above   No unusual exposure hx or h/o childhood pna/ asthma or knowledge of premature birth.  Current Allergies, Complete Past Medical History, Past Surgical History, Family History, and  Social History were reviewed in [employer] record.  ROS  The following are not active complaints unless bolded Hoarseness, sore throat, dysphagia, dental problems, itching, sneezing,  nasal congestion or discharge of excess mucus or purulent secretions, ear ache,   fever, chills, sweats, unintended wt loss or wt gain, classically pleuritic or exertional cp,  orthopnea pnd or arm/hand swelling  or leg swelling, presyncope, palpitations, abdominal pain, anorexia, nausea, vomiting, diarrhea  or change in bowel habits or change in bladder habits, change in stools or change in urine, dysuria, hematuria,  rash, arthralgias/back pain , visual complaints, headache, numbness, weakness or ataxia or problems with walking or coordination,  change in mood or  memory.        Current Meds  Medication Sig  . albuterol (PROVENTIL) (2.5 MG/3ML) 0.083% nebulizer solution Take 2.5 mg by nebulization 4 (four)  times daily.  . Fluticasone-Umeclidin-Vilant (TRELEGY ELLIPTA) 100-62.5-25 MCG/INH AEPB   . ibuprofen (ADVIL,MOTRIN) 200 MG tablet Take 400 mg by mouth every 6 (six) hours as needed for moderate pain.  Marland Kitchen levETIRAcetam (KEPPRA) 500 MG tablet Take 500 mg by mouth 2 (two) times daily.  Marland Kitchen levothyroxine (SYNTHROID) 112 MCG tablet TAKE 1 TABLET BEFORE BREAKFAST.  . methadone (DOLOPHINE) 10 MG tablet Take 10 mg by mouth every 8 (eight) hours.  . pregabalin (LYRICA) 75 MG capsule Take 75-150 mg by mouth at bedtime.                          Past Medical History:  Diagnosis Date  . Back pain   . CAD (coronary artery disease)   . Cholelithiasis   . COPD (chronic obstructive pulmonary disease) (HCC)   . Diabetes mellitus without complication [facility])    Patient states "borderline"  . Emphysema   . Hypothyroidism due to medication   . Prediabetes   . Thyroid disease        Objective:       Wt Readings from Last 3 Encounters:  08/03/20 208 lb (94.3 kg)  03/30/20 (!) 205 lb 3.2 oz (93.1 kg)  05/09/18 190 lb (86.2 kg)     Vital signs reviewed - Note on arrival 08/03/2020  02 sats  94% on 2lpm         HEENT : pt wearing mask not removed for exam due to covid -19 concerns.    NECK :  without JVD/Nodes/TM/ nl carotid upstrokes bilaterally   LUNGS: no acc muscle use,  Mod barrel  contour chest wall with bilateral  Distant bs s audible wheeze and  without cough on insp or exp maneuvers and mod  Hyperresonant  to  percussion bilaterally     CV:  RRR  no s3 or murmur or increase in P2, and no edema   ABD:  soft and nontender with pos mid insp Hoover's  in the supine position. No bruits or organomegaly appreciated, bowel sounds nl  MS:     ext warm without deformities, calf tenderness, cyanosis  - mod clubbing No obvious joint restrictions   SKIN: warm and dry without lesions    NEURO:  alert, approp, nl sensorium with  no motor or cerebellar deficits apparent.          CXR  PA and Lateral:   08/03/2020    I personally reviewed images and agree with radiology impression as follows:   Did not go to xray    Labs ordered  08/03/2020   :  alpha one AT phenotype  - did  Not go to lab     Assessment

## 2020-08-03 NOTE — Patient Instructions (Addendum)
Adjust your 02 flow to keep the saturation above 90% at all times especially with activity 9  Plan A = Automatic = Always=    Trelegy one click and two good drags   Hold for  5 seconds and out thru nose   Plan B = Backup (to supplement plan A, not to replace it) Only use your albuterol inhaler as a rescue medication to be used if you can't catch your breath by resting or doing a relaxed purse lip breathing pattern.  - The less you use it, the better it will work when you need it. - Ok to use the inhaler up to 2 puffs  every 4 hours if you must but call for appointment if use goes up over your usual need - Don't leave home without it !!  (think of it like the spare tire for your car)   Plan C = Crisis (instead of Plan B but only if Plan B stops working) - only use your albuterol nebulizer if you first try Plan B and it fails to help > ok to use the nebulizer up to every 4 hours but if start needing it regularly call for immediate appointment  Try albuterol 15 min before an activity that you know would make you short of breath and see if it makes any difference and if makes none then don't take it after activity unless you can't catch your breath.     Cefnir 300 mg twice daily x 10 days  Depomedrol 120 mg IM  The key is to stop smoking completely before smoking completely stops you!     Please remember to go to the lab and x-ray department at [facility]  for your tests - we will call you with the results when they are available.  Please schedule a follow up office visit in 6 weeks, call sooner if needed with pfts same day

## 2020-08-03 NOTE — Assessment & Plan Note (Addendum)
Active smoker  - 03/30/2020  After extensive coaching inhaler device,  effectiveness =    90% p coaching elipta, 50% p hfa both vs baseline nearly 0  - alpha one AT screen rec 03/30/2020 > did not go to lab> same problem 08/03/2020  - 08/03/2020  After extensive coaching inhaler device,  effectiveness =    0 on hfa/ 90% on elipta > continue trelegy 100 and  10 day rx omnicef for apparent rhintis/sinusitis/ bronchitis   Group D in terms of symptom/risk and laba/lama/ICS  therefore appropriate rx at this point >>>  Continue trelegy and approp saba  Since rhonchi on exam with discolored am mucus rec depomedrol 120 mg and omnicef rx  see avs for instructions unique to this ov   Re saba:  I spent extra time with pt today reviewing appropriate use of albuterol for prn use on exertion with the following points: 1) saba is for relief of sob that does not improve by walking a slower pace or resting but rather if the pt does not improve after trying this first. 2) If the pt is convinced, as many are, that saba helps recover from activity faster then it's easy to tell if this is the case by re-challenging : ie stop, take the inhaler, then p 5 minutes try the exact same activity (intensity of workload) that just caused the symptoms and see if they are substantially diminished or not after saba 3) if there is an activity that reproducibly causes the symptoms, try the saba 15 min before the activity on alternate days   If in fact the saba really does help, then fine to continue to use it prn but advised may need to look closer at the maintenance regimen being used to achieve better control of airways disease with exertion.

## 2020-08-03 NOTE — Assessment & Plan Note (Signed)

## 2020-08-03 NOTE — Assessment & Plan Note (Signed)
03/30/20 sats 86% RA on arrival 1st pulmonary eval   08/03/2020   Saturations on Room Air at Rest = 88%---increased to 94% o 2lpm o2 and maintained sats above 90% while ambulating on 2lpm x 100 ft and stopped due to sob but no desats > rec 2lpm hs and walking   Also advised: Make sure you check your oxygen saturations at highest level of activity to be sure it stays over 90% and adjust  02 flow upward to maintain this level if needed but remember to turn it back to previous settings when you stop (to conserve your supply).   F/u in pulmonary clnic in 6 weeks with all meds in hand          Each maintenance medication was reviewed in detail including emphasizing most importantly the difference between maintenance and prns and under what circumstances the prns are to be triggered using an action plan format where appropriate.  Total time for H and P, chart review, counseling, teaching device  directly observing portions of ambulatory 02 saturation study/  and generating customized AVS unique to this office visit / charting > 30 min

## 2020-08-04 ENCOUNTER — Ambulatory Visit: Payer: Medicare Other | Admitting: Internal Medicine

## 2020-08-11 ENCOUNTER — Other Ambulatory Visit (HOSPITAL_COMMUNITY): Payer: Self-pay | Admitting: Family Medicine

## 2020-08-11 ENCOUNTER — Ambulatory Visit (HOSPITAL_COMMUNITY)
Admission: RE | Admit: 2020-08-11 | Discharge: 2020-08-11 | Disposition: A | Discharge status: Home / Self Care | Payer: Medicare Other | Source: Ambulatory Visit | Attending: Family Medicine | Admitting: Family Medicine

## 2020-08-11 ENCOUNTER — Other Ambulatory Visit: Payer: Self-pay

## 2020-08-11 DIAGNOSIS — J189 Pneumonia, unspecified organism: Secondary | ICD-10-CM | POA: Diagnosis not present

## 2020-09-04 DEATH — deceased

## 2020-10-06 ENCOUNTER — Ambulatory Visit: Payer: Medicare Other | Admitting: "Endocrinology
